# Patient Record
Sex: Female | Born: 1935
Health system: Southern US, Community
[De-identification: ages and names within clinical notes are randomized; demographics above are authoritative.]

## PROBLEM LIST (undated history)

## (undated) DIAGNOSIS — J189 Pneumonia, unspecified organism: Secondary | ICD-10-CM

## (undated) DIAGNOSIS — M1710 Unilateral primary osteoarthritis, unspecified knee: Secondary | ICD-10-CM

## (undated) DIAGNOSIS — E041 Nontoxic single thyroid nodule: Secondary | ICD-10-CM

## (undated) DIAGNOSIS — F419 Anxiety disorder, unspecified: Secondary | ICD-10-CM

## (undated) DIAGNOSIS — K5790 Diverticulosis of intestine, part unspecified, without perforation or abscess without bleeding: Secondary | ICD-10-CM

## (undated) DIAGNOSIS — E039 Hypothyroidism, unspecified: Secondary | ICD-10-CM

## (undated) DIAGNOSIS — E7800 Pure hypercholesterolemia, unspecified: Secondary | ICD-10-CM

## (undated) DIAGNOSIS — R351 Nocturia: Secondary | ICD-10-CM

## (undated) HISTORY — PX: TONSILLECTOMY: SUR1361

## (undated) HISTORY — DX: Diverticulosis of intestine, part unspecified, without perforation or abscess without bleeding: K57.90

## (undated) HISTORY — DX: Anxiety disorder, unspecified: F41.9

## (undated) HISTORY — DX: Nocturia: R35.1

## (undated) HISTORY — PX: BREAST EXCISIONAL BIOPSY: SUR124

## (undated) HISTORY — PX: APPENDECTOMY: SHX54

## (undated) HISTORY — DX: Unilateral primary osteoarthritis, unspecified knee: M17.10

## (undated) HISTORY — DX: Pure hypercholesterolemia, unspecified: E78.00

## (undated) HISTORY — DX: Pneumonia, unspecified organism: J18.9

## (undated) HISTORY — DX: Nontoxic single thyroid nodule: E04.1

## (undated) HISTORY — DX: Hypothyroidism, unspecified: E03.9

---

## 1998-03-27 ENCOUNTER — Other Ambulatory Visit: Admission: RE | Admit: 1998-03-27 | Discharge: 1998-03-27 | Payer: Self-pay | Admitting: Obstetrics and Gynecology

## 1999-04-08 ENCOUNTER — Other Ambulatory Visit: Admission: RE | Admit: 1999-04-08 | Discharge: 1999-04-08 | Payer: Self-pay | Admitting: Obstetrics and Gynecology

## 1999-05-11 ENCOUNTER — Ambulatory Visit (HOSPITAL_COMMUNITY): Admission: RE | Admit: 1999-05-11 | Discharge: 1999-05-11 | Payer: Self-pay | Admitting: Cardiology

## 2000-01-14 ENCOUNTER — Other Ambulatory Visit: Admission: RE | Admit: 2000-01-14 | Discharge: 2000-01-14 | Payer: Self-pay | Admitting: Obstetrics and Gynecology

## 2000-04-13 ENCOUNTER — Other Ambulatory Visit: Admission: RE | Admit: 2000-04-13 | Discharge: 2000-04-13 | Payer: Self-pay | Admitting: Obstetrics and Gynecology

## 2001-04-24 ENCOUNTER — Other Ambulatory Visit: Admission: RE | Admit: 2001-04-24 | Discharge: 2001-04-24 | Payer: Self-pay | Admitting: Obstetrics and Gynecology

## 2001-06-23 ENCOUNTER — Ambulatory Visit (HOSPITAL_BASED_OUTPATIENT_CLINIC_OR_DEPARTMENT_OTHER): Admission: RE | Admit: 2001-06-23 | Discharge: 2001-06-24 | Payer: Self-pay | Admitting: Orthopedic Surgery

## 2002-08-01 ENCOUNTER — Ambulatory Visit (HOSPITAL_BASED_OUTPATIENT_CLINIC_OR_DEPARTMENT_OTHER): Admission: RE | Admit: 2002-08-01 | Discharge: 2002-08-01 | Payer: Self-pay | Admitting: Orthopedic Surgery

## 2004-09-14 ENCOUNTER — Ambulatory Visit (HOSPITAL_COMMUNITY): Admission: RE | Admit: 2004-09-14 | Discharge: 2004-09-14 | Payer: Self-pay | Admitting: *Deleted

## 2006-04-06 ENCOUNTER — Emergency Department: Payer: Self-pay | Admitting: Emergency Medicine

## 2011-02-24 ENCOUNTER — Encounter: Payer: Self-pay | Admitting: Internal Medicine

## 2011-02-25 ENCOUNTER — Encounter: Payer: Self-pay | Admitting: Internal Medicine

## 2011-02-25 ENCOUNTER — Ambulatory Visit (INDEPENDENT_AMBULATORY_CARE_PROVIDER_SITE_OTHER): Payer: Medicare Other | Admitting: Internal Medicine

## 2011-02-25 ENCOUNTER — Ambulatory Visit (INDEPENDENT_AMBULATORY_CARE_PROVIDER_SITE_OTHER)
Admission: RE | Admit: 2011-02-25 | Discharge: 2011-02-25 | Disposition: A | Discharge status: Home / Self Care | Payer: Medicare Other | Source: Ambulatory Visit | Attending: Internal Medicine | Admitting: Internal Medicine

## 2011-02-25 VITALS — BP 112/60 | HR 80 | Temp 98.0°F | Ht 66.0 in | Wt 167.8 lb

## 2011-02-25 DIAGNOSIS — R0609 Other forms of dyspnea: Secondary | ICD-10-CM

## 2011-02-25 DIAGNOSIS — R0600 Dyspnea, unspecified: Secondary | ICD-10-CM

## 2011-02-25 DIAGNOSIS — R0989 Other specified symptoms and signs involving the circulatory and respiratory systems: Secondary | ICD-10-CM

## 2011-02-25 NOTE — Patient Instructions (Addendum)
GERD (REFLUX)  is an extremely common cause of respiratory symptoms just like yours many times with no significant heartburn at all.    It can be treated with medication, but also with lifestyle changes including avoidance of late meals, excessive alcohol, smoking cessation, and avoid fatty foods, chocolate, peppermint, colas, red wine, and acidic juices such as orange juice.  NO MINT OR MENTHOL PRODUCTS SO NO COUGH DROPS  USE SUGARLESS CANDY INSTEAD (jolley ranchers or Stover's)  NO OIL BASED VITAMINS - use powdered substitutes.  I strongly recommend you stay off biphosphonates by mouth for up to a year to see what effect if any it has on your breathing and recurrent flares of "pneumonia" and if needed for your bones you could use Reclast IV once yearly as a substitute per Dr Lisbeth Ply.  Please remember to go to the x-ray department downstairs for your tests - we will call you with the results when they are available.    Please schedule a follow up visit in 3 months but call sooner if needed with pfts

## 2011-02-25 NOTE — Assessment & Plan Note (Addendum)
-   02/25/2011  Walked RA x 3 laps @ 185 ft each stopped due to end of study, no desats  Chronic mild sob with now persistent as dz on cxr and pattern of freq flares raising the concern of underlying esophageal dysfunction/ aspiration or BOOP (but note the apparent improvement s steroids) or perhaps even Hypersensitivity pneumonitis.  Lack of desat now against UIP/ IPF syndromes though not excluded not really treatable so best option is to rx conservatively and do everything we can to eliminate future "exogenous" pulm insults starting with GERD/ aspiration via es dysfunction.  For now rec eliminate biphosphonates, reviewed diet and plan f/u here  To be complete, ddx includes pulmonary fibrosis associated with rheumatologic diseases (which have a relatively benign course in most cases and don't really fit clinically here) , adverse effect from  drugs such as chemotherapy or macrodantin  Exposure( which I could not get a hx of exposure to) and  nonspecific interstitial pneumonia which is typically steroid responsive but looks a lot like IPF.   In active  smokers Langerhan's Cell  Histiocyctosis (eosinophilic granuomatosis),  DIP,  and Respiratory Bronchiolitis ILD also need to be considered but don't apply here since not smoking.

## 2011-02-25 NOTE — Progress Notes (Signed)
  Subjective:    Patient ID: Jennifer Joyce, female    DOB: 1931/04/22, 77 y.o.   MRN: [id number]  HPI  80 yowf never smoker with h/o allergies and asthma  Worse in spring > fall but never on meds with pneumonia x 8 over her lifetime the most recent at [facility] Feb 12 2011 referred by Dr Jennifer Joyce 02/25/2011 for ? PF.  02/25/2011 Jennifer Joyce/1st pulmonary eval at baseline  able to do yard work but this mostly consisted of sitting on a mower no push mower and no raking and sob x one flight s stopping and shops at [employer] s [facility] parking then acutely worse 3 weeks ago with dx of pna and now less energy but no sob at rest, sleeping at night.  First typically notices onset of achy center of chest then cough and sob and better p abx as on this occasion with no maint rx.  Cough is mostly dry. On day of ov back to baseline  Sleeping ok without nocturnal  or early am exacerbation  of respiratory  c/o's or need for noct saba. Also denies any obvious fluctuation of symptoms with weather or environmental changes or other aggravating or alleviating factors except as outlined above    Review of Systems  Constitutional: Negative for fever, chills and unexpected weight change.  HENT: Negative for ear pain, nosebleeds, congestion, sore throat, rhinorrhea, sneezing, trouble swallowing, dental problem, voice change, postnasal drip and sinus pressure.   Eyes: Negative for visual disturbance.  Respiratory: Positive for cough and shortness of breath. Negative for choking.   Cardiovascular: Positive for chest pain and leg swelling.  Gastrointestinal: Negative for vomiting, abdominal pain and diarrhea.  Genitourinary: Negative for difficulty urinating.  Musculoskeletal: Negative for arthralgias.  Skin: Negative for rash.  Neurological: Negative for tremors, syncope and headaches.  Hematological: Does not bruise/bleed easily.       Objective:   Physical Exam  amb depressed wf with classic voice fatigue.  Wt 167  02/25/2011   HEENT: nl dentition, turbinates, and orophanx. Nl external ear canals without cough reflex   NECK :  without JVD/Nodes/TM/ nl carotid upstrokes bilaterally   LUNGS: no acc muscle use, clear to A and P bilaterally without cough on insp or exp maneuvers   CV:  RRR  no s3 or murmur or increase in P2, no edema   ABD:  soft and nontender with nl excursion in the supine position. No bruits or organomegaly, bowel sounds nl  MS:  warm without deformities, calf tenderness, cyanosis or clubbing  SKIN: warm and dry without lesions    NEURO:  alert, approp, no deficits        Assessment & Plan:

## 2011-02-26 ENCOUNTER — Telehealth: Payer: Self-pay | Admitting: *Deleted

## 2011-02-26 NOTE — Telephone Encounter (Signed)
Pt needs ov with cxr in 4 wks- cxr 02/25/11 does not sow much improvement- LMTCB

## 2011-02-26 NOTE — Telephone Encounter (Signed)
Spoke with pt and notified of recs per MW. She verbalized understanding and states no questions. OV with MW with cxr sched for 03/30/11 at 9:15 am.

## 2011-02-26 NOTE — Telephone Encounter (Signed)
Message copied by Rosana Berger on Fri Feb 26, 2011  9:17 AM ------      Message from: Christinia Gully B      Created: Thu Feb 25, 2011  8:34 PM       I don't remember signing off on her cxr so I must have hit the wrong button when reviewing results but she does have residual changes and so we need her back here in 4 weeks for f/u cxr, sooner if any of her previous symptoms recur.            We also need a copy of any swallowing studies she's ever had

## 2011-02-26 NOTE — Progress Notes (Signed)
Quick Note:  Spoke with pt and notified of results per Dr. Wert. Pt verbalized understanding and denied any questions.  ______ 

## 2011-03-30 ENCOUNTER — Encounter: Payer: Self-pay | Admitting: Internal Medicine

## 2011-03-30 ENCOUNTER — Ambulatory Visit (INDEPENDENT_AMBULATORY_CARE_PROVIDER_SITE_OTHER)
Admission: RE | Admit: 2011-03-30 | Discharge: 2011-03-30 | Disposition: A | Discharge status: Home / Self Care | Payer: Medicare Other | Source: Ambulatory Visit | Attending: Internal Medicine | Admitting: Internal Medicine

## 2011-03-30 ENCOUNTER — Ambulatory Visit (INDEPENDENT_AMBULATORY_CARE_PROVIDER_SITE_OTHER): Payer: Medicare Other | Admitting: Internal Medicine

## 2011-03-30 VITALS — BP 120/80 | HR 59 | Temp 97.7°F | Ht 66.0 in | Wt 171.4 lb

## 2011-03-30 DIAGNOSIS — R0989 Other specified symptoms and signs involving the circulatory and respiratory systems: Secondary | ICD-10-CM

## 2011-03-30 DIAGNOSIS — R0600 Dyspnea, unspecified: Secondary | ICD-10-CM

## 2011-03-30 DIAGNOSIS — R0609 Other forms of dyspnea: Secondary | ICD-10-CM

## 2011-03-30 NOTE — Assessment & Plan Note (Addendum)
-   02/25/2011  Walked RA x 3 laps @ 185 ft each stopped due to end of study, no desats  CXR clearly improving so most likely this is a form of "organizing pneumonia" not progressive pulmonary fibrosis - the main issue she's focused on is why so many pneumonias?  It would be extremely unlikely she has some form of immunocompromise given the pattern ( every few years or so) - if becoming more frequent with aging then aspiration syndromes would have to be considered and in this context need to make sure GERD / dysphagia are adequately addressed and avoid any meds that could make this worse, esp oral biphosphonates, oil based vitamins, etc (reviewed diet)

## 2011-03-30 NOTE — Progress Notes (Signed)
  Subjective:    Patient ID: Jennifer Joyce, female    DOB: 1931-04-30, 77 y.o.   MRN: [id number]  HPI  69 yowf never smoker with h/o allergies and asthma  Worse in spring > fall but never on meds with pneumonia x 8 over her lifetime the most recent at [facility] Feb 12 2011 referred by Dr Lisbeth Ply 02/25/2011 for ? PF.  02/25/2011 Sequoyah Counterman/1st pulmonary eval at baseline  able to do yard work but this mostly consisted of sitting on a mower no push mower and no raking and sob x one flight s stopping and shops at [employer] s [facility] parking then acutely worse 3 weeks ago with dx of pna and now less energy but no sob at rest, sleeping at night.  First typically notices onset of achy center of chest then cough and sob and better p abx as on this occasion with no maint rx.  Cough is mostly dry. On day of ov back to baseline rec GERD  diet I strongly recommend you stay off biphosphonates by mouth for up to a year   03/30/2011 f/u ov/Cloyce Blankenhorn cc Pt states breathing unchanged since last visit- no better or worse - energy and cough better, doe x steps, still sense of choking some but clearly better off biphosphonates.   Sleeping ok without nocturnal  or early am exacerbation  of respiratory  c/o's or need for noct saba. Also denies any obvious fluctuation of symptoms with weather or environmental changes or other aggravating or alleviating factors except as outlined above.   ROS  At present neg for  any significant sore throat, dysphagia, itching, sneezing,  nasal congestion or excess/ purulent secretions,  fever, chills, sweats, unintended wt loss, pleuritic or exertional cp, hempoptysis, orthopnea pnd or leg swelling.  Also denies presyncope, palpitations, heartburn, abdominal pain, nausea, vomiting, diarrhea  or change in bowel or urinary habits, dysuria,hematuria,  rash, arthralgias, visual complaints, headache, numbness weakness or ataxia.           Objective:   Physical Exam  amb wf with minimal voice  fatigue  Wt 167 02/25/2011  > 03/30/2011  171  HEENT: nl dentition, turbinates, and orophanx. Nl external ear canals without cough reflex   NECK :  without JVD/Nodes/TM/ nl carotid upstrokes bilaterally   LUNGS: no acc muscle use, clear to A and P bilaterally without cough on insp or exp maneuvers   CV:  RRR  no s3 or murmur or increase in P2, no edema   ABD:  soft and nontender with nl excursion in the supine position. No bruits or organomegaly, bowel sounds nl  MS:  warm without deformities, calf tenderness, cyanosis or clubbing     CXR  03/30/2011 :  Some improvement in foci of patchy airspace disease noted previously.        Assessment & Plan:

## 2011-03-30 NOTE — Patient Instructions (Addendum)
Pepcid 20 mg one at bedtime as long as any coughing or choking or hoarseness or indigestion  Continue diet restrictions  Please schedule a follow up office visit in 6 weeks, call sooner if needed for final follow up cxr

## 2011-04-14 DIAGNOSIS — H5210 Myopia, unspecified eye: Secondary | ICD-10-CM | POA: Diagnosis not present

## 2011-04-14 DIAGNOSIS — H3530 Unspecified macular degeneration: Secondary | ICD-10-CM | POA: Diagnosis not present

## 2011-04-14 DIAGNOSIS — H2640 Unspecified secondary cataract: Secondary | ICD-10-CM | POA: Diagnosis not present

## 2011-05-04 DIAGNOSIS — Z1231 Encounter for screening mammogram for malignant neoplasm of breast: Secondary | ICD-10-CM | POA: Diagnosis not present

## 2011-05-04 DIAGNOSIS — Z8262 Family history of osteoporosis: Secondary | ICD-10-CM | POA: Diagnosis not present

## 2011-05-04 DIAGNOSIS — M810 Age-related osteoporosis without current pathological fracture: Secondary | ICD-10-CM | POA: Diagnosis not present

## 2011-05-18 ENCOUNTER — Other Ambulatory Visit: Payer: Self-pay | Admitting: *Deleted

## 2011-05-18 DIAGNOSIS — R0600 Dyspnea, unspecified: Secondary | ICD-10-CM

## 2011-05-19 ENCOUNTER — Encounter: Payer: Self-pay | Admitting: Internal Medicine

## 2011-05-19 ENCOUNTER — Ambulatory Visit (INDEPENDENT_AMBULATORY_CARE_PROVIDER_SITE_OTHER)
Admission: RE | Admit: 2011-05-19 | Discharge: 2011-05-19 | Disposition: A | Discharge status: Home / Self Care | Payer: Medicare Other | Source: Ambulatory Visit | Attending: Internal Medicine | Admitting: Internal Medicine

## 2011-05-19 ENCOUNTER — Ambulatory Visit (INDEPENDENT_AMBULATORY_CARE_PROVIDER_SITE_OTHER): Payer: Medicare Other | Admitting: Internal Medicine

## 2011-05-19 VITALS — BP 112/60 | HR 69 | Temp 97.7°F | Ht 65.0 in | Wt 170.0 lb

## 2011-05-19 DIAGNOSIS — J438 Other emphysema: Secondary | ICD-10-CM | POA: Diagnosis not present

## 2011-05-19 DIAGNOSIS — R0609 Other forms of dyspnea: Secondary | ICD-10-CM

## 2011-05-19 DIAGNOSIS — R0989 Other specified symptoms and signs involving the circulatory and respiratory systems: Secondary | ICD-10-CM

## 2011-05-19 DIAGNOSIS — R0600 Dyspnea, unspecified: Secondary | ICD-10-CM

## 2011-05-19 DIAGNOSIS — J189 Pneumonia, unspecified organism: Secondary | ICD-10-CM | POA: Diagnosis not present

## 2011-05-19 DIAGNOSIS — J9819 Other pulmonary collapse: Secondary | ICD-10-CM | POA: Diagnosis not present

## 2011-05-19 NOTE — Assessment & Plan Note (Signed)
-   02/25/2011  Walked RA x 3 laps @ 185 ft each stopped due to end of study, no desats   Her cxr is back to baseline showing some scarring bilaterally c/w prev pneumonia(s) and does need further attention x to point out the need to prevent further lung injury in the future, esp related to the GI tract. She notices less symptoms when taking Pepcid so rec she continue at least an hs dose and avoid oral biphosphonates with low threshold to do formal swallowing eval per ST/ barium swallow studies if not improving.

## 2011-05-19 NOTE — Progress Notes (Signed)
  Subjective:    Patient ID: Jennifer Joyce, female    DOB: 23-Sep-1931.   MRN: [id number]  HPI  Brief patient profile:  60 yowf never smoker with h/o allergies and asthma  Worse in spring > fall but never on meds with pneumonia x 8 over her lifetime the most recent at [facility] Feb 12 2011 referred by Dr Lisbeth Ply 02/25/2011 for ? PF.  02/25/2011 Jennifer Joyce/1st pulmonary eval at baseline  able to do yard work but this mostly consisted of sitting on a mower no push mower and no raking and sob x one flight s stopping and shops at [employer] s [facility] parking then acutely worse 3 weeks ago with dx of pna and now less energy but no sob at rest, sleeping at night.  First typically notices onset of achy center of chest then cough and sob and better p abx as on this occasion with no maint rx.  Cough is mostly dry. On day of ov back to baseline rec GERD  diet I strongly recommend you stay off biphosphonates by mouth for up to a year   03/30/2011 f/u ov/Jennifer Joyce cc Pt states breathing unchanged since last visit- no better or worse - energy and cough better, doe x steps, still sense of choking some but clearly better off biphosphonates.  rec Pepcid 20 mg one at bedtime as long as any coughing or choking or hoarseness or indigestion Continue diet restrictions cxr in 6 weeks    05/19/2011 f/u ov/Jennifer Joyce cc sense of choking better when remembers to take pepcid at hs, doe = baseline one flight of steps/ walking for 15 min, not really limiting daytime function. No cough/ excess sputum production  Sleeping ok without nocturnal  or early am exacerbation  of respiratory  c/o's or need for noct saba. Also denies any obvious fluctuation of symptoms with weather or environmental changes or other aggravating or alleviating factors except as outlined above.   ROS  At present neg for  any significant sore throat, dysphagia, itching, sneezing,  nasal congestion or excess/ purulent secretions,  fever, chills, sweats, unintended wt loss,  pleuritic or exertional cp, hempoptysis, orthopnea pnd or leg swelling.  Also denies presyncope, palpitations, heartburn, abdominal pain, nausea, vomiting, diarrhea  or change in bowel or urinary habits, dysuria,hematuria,  rash, arthralgias, visual complaints, headache, numbness weakness or ataxia.           Objective:   Physical Exam  amb wf with minimal voice fatigue  Wt 167 02/25/2011  > 03/30/2011  171> 05/19/2011  170  HEENT: nl dentition, turbinates, and orophanx. Nl external ear canals without cough reflex   NECK :  without JVD/Nodes/TM/ nl carotid upstrokes bilaterally   LUNGS: no acc muscle use, clear to A and P bilaterally without cough on insp or exp maneuvers   CV:  RRR  no s3 or murmur or increase in P2, no edema   ABD:  soft and nontender with nl excursion in the supine position. No bruits or organomegaly, bowel sounds nl  MS:  warm without deformities, calf tenderness, cyanosis or clubbing      CXR  05/19/2011 :   Emphysematous and chronic bronchitic changes with bibasilar  atelectasis versus scarring.  Improved basilar airspace infiltrates.        Assessment & Plan:

## 2011-05-19 NOTE — Patient Instructions (Signed)
Unless you feel you loosing ground with activity tolerance due to breathing your follow up here can be as needed  Based on the intermittent choking sensation you probably have reflux and should take at least pepcid 20 mg one at bedtime and avoid biphosphonates by mouth like actonel but you could if necessary use the IV form = Reclast   GERD (REFLUX)  is an extremely common cause of respiratory symptoms, many times with no significant heartburn at all.    It can be treated with medication, but also with lifestyle changes including avoidance of late meals, excessive alcohol, smoking cessation, and avoid fatty foods, chocolate, peppermint, colas, red wine, and acidic juices such as orange juice.  NO MINT OR MENTHOL PRODUCTS SO NO COUGH DROPS  USE SUGARLESS CANDY INSTEAD (jolley ranchers or Stover's)  NO OIL BASED VITAMINS - use powdered substitutes.    Return is as needed

## 2011-07-09 DIAGNOSIS — H35369 Drusen (degenerative) of macula, unspecified eye: Secondary | ICD-10-CM | POA: Diagnosis not present

## 2011-07-09 DIAGNOSIS — H353190 Nonexudative age-related macular degeneration, unspecified eye, stage unspecified: Secondary | ICD-10-CM | POA: Diagnosis not present

## 2011-09-17 DIAGNOSIS — M79609 Pain in unspecified limb: Secondary | ICD-10-CM | POA: Diagnosis not present

## 2011-09-17 DIAGNOSIS — R609 Edema, unspecified: Secondary | ICD-10-CM | POA: Diagnosis not present

## 2011-09-22 DIAGNOSIS — Z79899 Other long term (current) drug therapy: Secondary | ICD-10-CM | POA: Diagnosis not present

## 2011-09-22 DIAGNOSIS — E7800 Pure hypercholesterolemia, unspecified: Secondary | ICD-10-CM | POA: Diagnosis not present

## 2011-09-22 DIAGNOSIS — E039 Hypothyroidism, unspecified: Secondary | ICD-10-CM | POA: Diagnosis not present

## 2011-09-24 DIAGNOSIS — E039 Hypothyroidism, unspecified: Secondary | ICD-10-CM | POA: Diagnosis not present

## 2011-09-24 DIAGNOSIS — E7800 Pure hypercholesterolemia, unspecified: Secondary | ICD-10-CM | POA: Diagnosis not present

## 2011-09-24 DIAGNOSIS — F411 Generalized anxiety disorder: Secondary | ICD-10-CM | POA: Diagnosis not present

## 2011-09-24 DIAGNOSIS — R7309 Other abnormal glucose: Secondary | ICD-10-CM | POA: Diagnosis not present

## 2011-09-29 ENCOUNTER — Ambulatory Visit (HOSPITAL_COMMUNITY): Payer: Medicare Other | Attending: Cardiology | Admitting: Radiology

## 2011-09-29 ENCOUNTER — Other Ambulatory Visit (HOSPITAL_COMMUNITY): Payer: Self-pay | Admitting: Radiology

## 2011-09-29 DIAGNOSIS — I359 Nonrheumatic aortic valve disorder, unspecified: Secondary | ICD-10-CM | POA: Diagnosis not present

## 2011-09-29 DIAGNOSIS — R0609 Other forms of dyspnea: Secondary | ICD-10-CM | POA: Diagnosis not present

## 2011-09-29 DIAGNOSIS — M7989 Other specified soft tissue disorders: Secondary | ICD-10-CM | POA: Insufficient documentation

## 2011-09-29 DIAGNOSIS — R0989 Other specified symptoms and signs involving the circulatory and respiratory systems: Secondary | ICD-10-CM | POA: Diagnosis not present

## 2011-09-29 DIAGNOSIS — I079 Rheumatic tricuspid valve disease, unspecified: Secondary | ICD-10-CM | POA: Diagnosis not present

## 2011-09-29 DIAGNOSIS — R5381 Other malaise: Secondary | ICD-10-CM | POA: Insufficient documentation

## 2011-09-29 DIAGNOSIS — R609 Edema, unspecified: Secondary | ICD-10-CM

## 2011-09-29 DIAGNOSIS — R5383 Other fatigue: Secondary | ICD-10-CM | POA: Insufficient documentation

## 2011-09-29 NOTE — Progress Notes (Signed)
Echocardiogram performed.  

## 2011-09-30 ENCOUNTER — Encounter (HOSPITAL_COMMUNITY): Payer: Self-pay | Admitting: Family Medicine

## 2011-10-01 DIAGNOSIS — R0609 Other forms of dyspnea: Secondary | ICD-10-CM | POA: Diagnosis not present

## 2011-10-04 DIAGNOSIS — D649 Anemia, unspecified: Secondary | ICD-10-CM | POA: Diagnosis not present

## 2011-10-04 DIAGNOSIS — D631 Anemia in chronic kidney disease: Secondary | ICD-10-CM | POA: Diagnosis not present

## 2011-10-06 DIAGNOSIS — R5383 Other fatigue: Secondary | ICD-10-CM | POA: Diagnosis not present

## 2011-10-06 DIAGNOSIS — R5381 Other malaise: Secondary | ICD-10-CM | POA: Diagnosis not present

## 2011-10-06 DIAGNOSIS — Z1212 Encounter for screening for malignant neoplasm of rectum: Secondary | ICD-10-CM | POA: Diagnosis not present

## 2011-10-08 DIAGNOSIS — Z1212 Encounter for screening for malignant neoplasm of rectum: Secondary | ICD-10-CM | POA: Diagnosis not present

## 2011-10-11 DIAGNOSIS — R3129 Other microscopic hematuria: Secondary | ICD-10-CM | POA: Diagnosis not present

## 2011-10-11 DIAGNOSIS — R319 Hematuria, unspecified: Secondary | ICD-10-CM | POA: Diagnosis not present

## 2011-10-12 DIAGNOSIS — R509 Fever, unspecified: Secondary | ICD-10-CM | POA: Diagnosis not present

## 2011-10-12 DIAGNOSIS — R195 Other fecal abnormalities: Secondary | ICD-10-CM | POA: Diagnosis not present

## 2011-10-12 DIAGNOSIS — R5381 Other malaise: Secondary | ICD-10-CM | POA: Diagnosis not present

## 2011-10-12 DIAGNOSIS — E039 Hypothyroidism, unspecified: Secondary | ICD-10-CM | POA: Diagnosis not present

## 2011-10-12 DIAGNOSIS — E7800 Pure hypercholesterolemia, unspecified: Secondary | ICD-10-CM | POA: Diagnosis not present

## 2011-10-12 DIAGNOSIS — D649 Anemia, unspecified: Secondary | ICD-10-CM | POA: Diagnosis not present

## 2011-10-12 DIAGNOSIS — N390 Urinary tract infection, site not specified: Secondary | ICD-10-CM | POA: Diagnosis not present

## 2011-10-12 DIAGNOSIS — R7881 Bacteremia: Secondary | ICD-10-CM | POA: Diagnosis not present

## 2011-10-12 DIAGNOSIS — R0602 Shortness of breath: Secondary | ICD-10-CM | POA: Diagnosis not present

## 2011-10-12 DIAGNOSIS — R5383 Other fatigue: Secondary | ICD-10-CM | POA: Diagnosis not present

## 2011-10-13 DIAGNOSIS — R262 Difficulty in walking, not elsewhere classified: Secondary | ICD-10-CM | POA: Diagnosis not present

## 2011-10-13 DIAGNOSIS — F411 Generalized anxiety disorder: Secondary | ICD-10-CM | POA: Diagnosis present

## 2011-10-13 DIAGNOSIS — I509 Heart failure, unspecified: Secondary | ICD-10-CM | POA: Diagnosis not present

## 2011-10-13 DIAGNOSIS — I059 Rheumatic mitral valve disease, unspecified: Secondary | ICD-10-CM | POA: Diagnosis not present

## 2011-10-13 DIAGNOSIS — I5021 Acute systolic (congestive) heart failure: Secondary | ICD-10-CM | POA: Diagnosis not present

## 2011-10-13 DIAGNOSIS — R011 Cardiac murmur, unspecified: Secondary | ICD-10-CM | POA: Diagnosis not present

## 2011-10-13 DIAGNOSIS — R9431 Abnormal electrocardiogram [ECG] [EKG]: Secondary | ICD-10-CM | POA: Diagnosis not present

## 2011-10-13 DIAGNOSIS — R0602 Shortness of breath: Secondary | ICD-10-CM | POA: Diagnosis not present

## 2011-10-13 DIAGNOSIS — R627 Adult failure to thrive: Secondary | ICD-10-CM | POA: Diagnosis present

## 2011-10-13 DIAGNOSIS — Z5189 Encounter for other specified aftercare: Secondary | ICD-10-CM | POA: Diagnosis not present

## 2011-10-13 DIAGNOSIS — R195 Other fecal abnormalities: Secondary | ICD-10-CM | POA: Diagnosis present

## 2011-10-13 DIAGNOSIS — B952 Enterococcus as the cause of diseases classified elsewhere: Secondary | ICD-10-CM | POA: Diagnosis present

## 2011-10-13 DIAGNOSIS — I369 Nonrheumatic tricuspid valve disorder, unspecified: Secondary | ICD-10-CM | POA: Diagnosis not present

## 2011-10-13 DIAGNOSIS — R5381 Other malaise: Secondary | ICD-10-CM | POA: Diagnosis present

## 2011-10-13 DIAGNOSIS — D62 Acute posthemorrhagic anemia: Secondary | ICD-10-CM | POA: Diagnosis not present

## 2011-10-13 DIAGNOSIS — D500 Iron deficiency anemia secondary to blood loss (chronic): Secondary | ICD-10-CM | POA: Diagnosis present

## 2011-10-13 DIAGNOSIS — Z0389 Encounter for observation for other suspected diseases and conditions ruled out: Secondary | ICD-10-CM | POA: Diagnosis not present

## 2011-10-13 DIAGNOSIS — I339 Acute and subacute endocarditis, unspecified: Secondary | ICD-10-CM | POA: Diagnosis not present

## 2011-10-13 DIAGNOSIS — N179 Acute kidney failure, unspecified: Secondary | ICD-10-CM | POA: Diagnosis not present

## 2011-10-13 DIAGNOSIS — I38 Endocarditis, valve unspecified: Secondary | ICD-10-CM | POA: Diagnosis not present

## 2011-10-13 DIAGNOSIS — R5383 Other fatigue: Secondary | ICD-10-CM | POA: Diagnosis not present

## 2011-10-13 DIAGNOSIS — H3530 Unspecified macular degeneration: Secondary | ICD-10-CM | POA: Diagnosis not present

## 2011-10-13 DIAGNOSIS — D638 Anemia in other chronic diseases classified elsewhere: Secondary | ICD-10-CM | POA: Diagnosis present

## 2011-10-13 DIAGNOSIS — F329 Major depressive disorder, single episode, unspecified: Secondary | ICD-10-CM | POA: Diagnosis present

## 2011-10-13 DIAGNOSIS — R112 Nausea with vomiting, unspecified: Secondary | ICD-10-CM | POA: Diagnosis not present

## 2011-10-13 DIAGNOSIS — E8779 Other fluid overload: Secondary | ICD-10-CM | POA: Diagnosis not present

## 2011-10-13 DIAGNOSIS — A419 Sepsis, unspecified organism: Secondary | ICD-10-CM | POA: Diagnosis not present

## 2011-10-13 DIAGNOSIS — J189 Pneumonia, unspecified organism: Secondary | ICD-10-CM | POA: Diagnosis not present

## 2011-10-13 DIAGNOSIS — E039 Hypothyroidism, unspecified: Secondary | ICD-10-CM | POA: Diagnosis present

## 2011-10-13 DIAGNOSIS — K922 Gastrointestinal hemorrhage, unspecified: Secondary | ICD-10-CM | POA: Diagnosis not present

## 2011-10-13 DIAGNOSIS — N390 Urinary tract infection, site not specified: Secondary | ICD-10-CM | POA: Diagnosis not present

## 2011-10-13 DIAGNOSIS — A409 Streptococcal sepsis, unspecified: Secondary | ICD-10-CM | POA: Diagnosis not present

## 2011-10-13 DIAGNOSIS — M6281 Muscle weakness (generalized): Secondary | ICD-10-CM | POA: Diagnosis not present

## 2011-10-13 DIAGNOSIS — J154 Pneumonia due to other streptococci: Secondary | ICD-10-CM | POA: Diagnosis not present

## 2011-10-13 DIAGNOSIS — I501 Left ventricular failure: Secondary | ICD-10-CM | POA: Diagnosis not present

## 2011-10-13 DIAGNOSIS — I129 Hypertensive chronic kidney disease with stage 1 through stage 4 chronic kidney disease, or unspecified chronic kidney disease: Secondary | ICD-10-CM | POA: Diagnosis present

## 2011-10-13 DIAGNOSIS — I330 Acute and subacute infective endocarditis: Secondary | ICD-10-CM | POA: Diagnosis present

## 2011-10-13 DIAGNOSIS — R7881 Bacteremia: Secondary | ICD-10-CM | POA: Diagnosis not present

## 2011-10-13 DIAGNOSIS — N189 Chronic kidney disease, unspecified: Secondary | ICD-10-CM | POA: Diagnosis not present

## 2011-10-13 DIAGNOSIS — N184 Chronic kidney disease, stage 4 (severe): Secondary | ICD-10-CM | POA: Diagnosis not present

## 2011-10-13 DIAGNOSIS — D649 Anemia, unspecified: Secondary | ICD-10-CM | POA: Diagnosis not present

## 2011-10-13 DIAGNOSIS — E7800 Pure hypercholesterolemia, unspecified: Secondary | ICD-10-CM | POA: Diagnosis not present

## 2011-10-24 DIAGNOSIS — F329 Major depressive disorder, single episode, unspecified: Secondary | ICD-10-CM | POA: Diagnosis not present

## 2011-10-24 DIAGNOSIS — N189 Chronic kidney disease, unspecified: Secondary | ICD-10-CM | POA: Diagnosis not present

## 2011-10-24 DIAGNOSIS — Z79899 Other long term (current) drug therapy: Secondary | ICD-10-CM | POA: Diagnosis not present

## 2011-10-24 DIAGNOSIS — Z5189 Encounter for other specified aftercare: Secondary | ICD-10-CM | POA: Diagnosis not present

## 2011-10-24 DIAGNOSIS — R262 Difficulty in walking, not elsewhere classified: Secondary | ICD-10-CM | POA: Diagnosis not present

## 2011-10-24 DIAGNOSIS — F411 Generalized anxiety disorder: Secondary | ICD-10-CM | POA: Diagnosis present

## 2011-10-24 DIAGNOSIS — E785 Hyperlipidemia, unspecified: Secondary | ICD-10-CM | POA: Diagnosis not present

## 2011-10-24 DIAGNOSIS — R9431 Abnormal electrocardiogram [ECG] [EKG]: Secondary | ICD-10-CM | POA: Diagnosis not present

## 2011-10-24 DIAGNOSIS — K922 Gastrointestinal hemorrhage, unspecified: Secondary | ICD-10-CM | POA: Diagnosis not present

## 2011-10-24 DIAGNOSIS — B952 Enterococcus as the cause of diseases classified elsewhere: Secondary | ICD-10-CM | POA: Diagnosis not present

## 2011-10-24 DIAGNOSIS — I359 Nonrheumatic aortic valve disorder, unspecified: Secondary | ICD-10-CM | POA: Diagnosis not present

## 2011-10-24 DIAGNOSIS — I339 Acute and subacute endocarditis, unspecified: Secondary | ICD-10-CM | POA: Diagnosis not present

## 2011-10-24 DIAGNOSIS — R6520 Severe sepsis without septic shock: Secondary | ICD-10-CM | POA: Diagnosis not present

## 2011-10-24 DIAGNOSIS — E8779 Other fluid overload: Secondary | ICD-10-CM | POA: Diagnosis not present

## 2011-10-24 DIAGNOSIS — I330 Acute and subacute infective endocarditis: Secondary | ICD-10-CM | POA: Diagnosis not present

## 2011-10-24 DIAGNOSIS — F3289 Other specified depressive episodes: Secondary | ICD-10-CM | POA: Diagnosis not present

## 2011-10-24 DIAGNOSIS — J9600 Acute respiratory failure, unspecified whether with hypoxia or hypercapnia: Secondary | ICD-10-CM | POA: Diagnosis not present

## 2011-10-24 DIAGNOSIS — D631 Anemia in chronic kidney disease: Secondary | ICD-10-CM | POA: Diagnosis not present

## 2011-10-24 DIAGNOSIS — J811 Chronic pulmonary edema: Secondary | ICD-10-CM | POA: Diagnosis not present

## 2011-10-24 DIAGNOSIS — H3530 Unspecified macular degeneration: Secondary | ICD-10-CM | POA: Diagnosis present

## 2011-10-24 DIAGNOSIS — N179 Acute kidney failure, unspecified: Secondary | ICD-10-CM | POA: Diagnosis not present

## 2011-10-24 DIAGNOSIS — I501 Left ventricular failure: Secondary | ICD-10-CM | POA: Diagnosis not present

## 2011-10-24 DIAGNOSIS — N039 Chronic nephritic syndrome with unspecified morphologic changes: Secondary | ICD-10-CM | POA: Diagnosis not present

## 2011-10-24 DIAGNOSIS — N289 Disorder of kidney and ureter, unspecified: Secondary | ICD-10-CM | POA: Diagnosis not present

## 2011-10-24 DIAGNOSIS — Z7901 Long term (current) use of anticoagulants: Secondary | ICD-10-CM | POA: Diagnosis not present

## 2011-10-24 DIAGNOSIS — R0602 Shortness of breath: Secondary | ICD-10-CM | POA: Diagnosis not present

## 2011-10-24 DIAGNOSIS — E039 Hypothyroidism, unspecified: Secondary | ICD-10-CM | POA: Diagnosis not present

## 2011-10-24 DIAGNOSIS — Z5181 Encounter for therapeutic drug level monitoring: Secondary | ICD-10-CM | POA: Diagnosis not present

## 2011-10-24 DIAGNOSIS — M6281 Muscle weakness (generalized): Secondary | ICD-10-CM | POA: Diagnosis not present

## 2011-10-24 DIAGNOSIS — N183 Chronic kidney disease, stage 3 unspecified: Secondary | ICD-10-CM | POA: Diagnosis not present

## 2011-10-24 DIAGNOSIS — I252 Old myocardial infarction: Secondary | ICD-10-CM | POA: Diagnosis not present

## 2011-10-24 DIAGNOSIS — I509 Heart failure, unspecified: Secondary | ICD-10-CM | POA: Diagnosis not present

## 2011-10-24 DIAGNOSIS — R197 Diarrhea, unspecified: Secondary | ICD-10-CM | POA: Diagnosis not present

## 2011-10-24 DIAGNOSIS — R627 Adult failure to thrive: Secondary | ICD-10-CM | POA: Diagnosis not present

## 2011-10-24 DIAGNOSIS — Z794 Long term (current) use of insulin: Secondary | ICD-10-CM | POA: Diagnosis not present

## 2011-10-24 DIAGNOSIS — J154 Pneumonia due to other streptococci: Secondary | ICD-10-CM | POA: Diagnosis not present

## 2011-10-24 DIAGNOSIS — I5021 Acute systolic (congestive) heart failure: Secondary | ICD-10-CM | POA: Diagnosis not present

## 2011-10-24 DIAGNOSIS — I38 Endocarditis, valve unspecified: Secondary | ICD-10-CM | POA: Diagnosis not present

## 2011-10-24 DIAGNOSIS — I369 Nonrheumatic tricuspid valve disorder, unspecified: Secondary | ICD-10-CM | POA: Diagnosis not present

## 2011-10-27 DIAGNOSIS — E039 Hypothyroidism, unspecified: Secondary | ICD-10-CM | POA: Diagnosis not present

## 2011-10-27 DIAGNOSIS — R197 Diarrhea, unspecified: Secondary | ICD-10-CM | POA: Diagnosis not present

## 2011-10-27 DIAGNOSIS — E785 Hyperlipidemia, unspecified: Secondary | ICD-10-CM | POA: Diagnosis not present

## 2011-10-27 DIAGNOSIS — I330 Acute and subacute infective endocarditis: Secondary | ICD-10-CM | POA: Diagnosis not present

## 2011-10-29 DIAGNOSIS — Z79899 Other long term (current) drug therapy: Secondary | ICD-10-CM | POA: Diagnosis not present

## 2011-10-29 DIAGNOSIS — N289 Disorder of kidney and ureter, unspecified: Secondary | ICD-10-CM | POA: Diagnosis not present

## 2011-10-29 DIAGNOSIS — I38 Endocarditis, valve unspecified: Secondary | ICD-10-CM | POA: Diagnosis not present

## 2011-10-30 DIAGNOSIS — Z7901 Long term (current) use of anticoagulants: Secondary | ICD-10-CM | POA: Diagnosis not present

## 2011-11-03 DIAGNOSIS — I509 Heart failure, unspecified: Secondary | ICD-10-CM | POA: Diagnosis not present

## 2011-11-03 DIAGNOSIS — I38 Endocarditis, valve unspecified: Secondary | ICD-10-CM | POA: Diagnosis not present

## 2011-11-03 DIAGNOSIS — N039 Chronic nephritic syndrome with unspecified morphologic changes: Secondary | ICD-10-CM | POA: Diagnosis not present

## 2011-11-03 DIAGNOSIS — F329 Major depressive disorder, single episode, unspecified: Secondary | ICD-10-CM | POA: Diagnosis not present

## 2011-11-03 DIAGNOSIS — I359 Nonrheumatic aortic valve disorder, unspecified: Secondary | ICD-10-CM | POA: Diagnosis not present

## 2011-11-03 DIAGNOSIS — F411 Generalized anxiety disorder: Secondary | ICD-10-CM | POA: Diagnosis present

## 2011-11-03 DIAGNOSIS — E785 Hyperlipidemia, unspecified: Secondary | ICD-10-CM | POA: Diagnosis present

## 2011-11-03 DIAGNOSIS — R9431 Abnormal electrocardiogram [ECG] [EKG]: Secondary | ICD-10-CM | POA: Diagnosis not present

## 2011-11-03 DIAGNOSIS — Z794 Long term (current) use of insulin: Secondary | ICD-10-CM | POA: Diagnosis not present

## 2011-11-03 DIAGNOSIS — N189 Chronic kidney disease, unspecified: Secondary | ICD-10-CM | POA: Diagnosis not present

## 2011-11-03 DIAGNOSIS — D631 Anemia in chronic kidney disease: Secondary | ICD-10-CM | POA: Diagnosis not present

## 2011-11-03 DIAGNOSIS — J9600 Acute respiratory failure, unspecified whether with hypoxia or hypercapnia: Secondary | ICD-10-CM | POA: Diagnosis not present

## 2011-11-03 DIAGNOSIS — I252 Old myocardial infarction: Secondary | ICD-10-CM | POA: Diagnosis not present

## 2011-11-03 DIAGNOSIS — E039 Hypothyroidism, unspecified: Secondary | ICD-10-CM | POA: Diagnosis not present

## 2011-11-03 DIAGNOSIS — R0602 Shortness of breath: Secondary | ICD-10-CM | POA: Diagnosis not present

## 2011-11-03 DIAGNOSIS — N179 Acute kidney failure, unspecified: Secondary | ICD-10-CM | POA: Diagnosis not present

## 2011-11-03 DIAGNOSIS — F3289 Other specified depressive episodes: Secondary | ICD-10-CM | POA: Diagnosis not present

## 2011-11-03 DIAGNOSIS — H3530 Unspecified macular degeneration: Secondary | ICD-10-CM | POA: Diagnosis present

## 2011-11-03 DIAGNOSIS — J811 Chronic pulmonary edema: Secondary | ICD-10-CM | POA: Diagnosis not present

## 2011-11-03 DIAGNOSIS — Z79899 Other long term (current) drug therapy: Secondary | ICD-10-CM | POA: Diagnosis not present

## 2011-11-03 DIAGNOSIS — N183 Chronic kidney disease, stage 3 unspecified: Secondary | ICD-10-CM | POA: Diagnosis not present

## 2011-11-04 DIAGNOSIS — R0901 Asphyxia: Secondary | ICD-10-CM | POA: Diagnosis not present

## 2011-11-04 DIAGNOSIS — I38 Endocarditis, valve unspecified: Secondary | ICD-10-CM | POA: Diagnosis not present

## 2011-11-04 DIAGNOSIS — G8918 Other acute postprocedural pain: Secondary | ICD-10-CM | POA: Diagnosis not present

## 2011-11-04 DIAGNOSIS — I498 Other specified cardiac arrhythmias: Secondary | ICD-10-CM | POA: Diagnosis not present

## 2011-11-04 DIAGNOSIS — R82998 Other abnormal findings in urine: Secondary | ICD-10-CM | POA: Diagnosis not present

## 2011-11-04 DIAGNOSIS — I369 Nonrheumatic tricuspid valve disorder, unspecified: Secondary | ICD-10-CM | POA: Diagnosis not present

## 2011-11-04 DIAGNOSIS — M79609 Pain in unspecified limb: Secondary | ICD-10-CM | POA: Diagnosis not present

## 2011-11-04 DIAGNOSIS — E789 Disorder of lipoprotein metabolism, unspecified: Secondary | ICD-10-CM | POA: Diagnosis not present

## 2011-11-04 DIAGNOSIS — Z79899 Other long term (current) drug therapy: Secondary | ICD-10-CM | POA: Diagnosis not present

## 2011-11-04 DIAGNOSIS — D696 Thrombocytopenia, unspecified: Secondary | ICD-10-CM | POA: Diagnosis not present

## 2011-11-04 DIAGNOSIS — R9431 Abnormal electrocardiogram [ECG] [EKG]: Secondary | ICD-10-CM | POA: Diagnosis not present

## 2011-11-04 DIAGNOSIS — N200 Calculus of kidney: Secondary | ICD-10-CM | POA: Diagnosis not present

## 2011-11-04 DIAGNOSIS — H269 Unspecified cataract: Secondary | ICD-10-CM | POA: Diagnosis not present

## 2011-11-04 DIAGNOSIS — F329 Major depressive disorder, single episode, unspecified: Secondary | ICD-10-CM | POA: Diagnosis present

## 2011-11-04 DIAGNOSIS — Z954 Presence of other heart-valve replacement: Secondary | ICD-10-CM | POA: Diagnosis not present

## 2011-11-04 DIAGNOSIS — Z951 Presence of aortocoronary bypass graft: Secondary | ICD-10-CM | POA: Diagnosis not present

## 2011-11-04 DIAGNOSIS — I330 Acute and subacute infective endocarditis: Secondary | ICD-10-CM | POA: Diagnosis not present

## 2011-11-04 DIAGNOSIS — D649 Anemia, unspecified: Secondary | ICD-10-CM | POA: Diagnosis not present

## 2011-11-04 DIAGNOSIS — Z48812 Encounter for surgical aftercare following surgery on the circulatory system: Secondary | ICD-10-CM | POA: Diagnosis not present

## 2011-11-04 DIAGNOSIS — J8410 Pulmonary fibrosis, unspecified: Secondary | ICD-10-CM | POA: Diagnosis not present

## 2011-11-04 DIAGNOSIS — J811 Chronic pulmonary edema: Secondary | ICD-10-CM | POA: Diagnosis not present

## 2011-11-04 DIAGNOSIS — E46 Unspecified protein-calorie malnutrition: Secondary | ICD-10-CM | POA: Diagnosis not present

## 2011-11-04 DIAGNOSIS — J90 Pleural effusion, not elsewhere classified: Secondary | ICD-10-CM | POA: Diagnosis not present

## 2011-11-04 DIAGNOSIS — R6889 Other general symptoms and signs: Secondary | ICD-10-CM | POA: Diagnosis not present

## 2011-11-04 DIAGNOSIS — N170 Acute kidney failure with tubular necrosis: Secondary | ICD-10-CM | POA: Diagnosis not present

## 2011-11-04 DIAGNOSIS — I359 Nonrheumatic aortic valve disorder, unspecified: Secondary | ICD-10-CM | POA: Diagnosis not present

## 2011-11-04 DIAGNOSIS — I5021 Acute systolic (congestive) heart failure: Secondary | ICD-10-CM | POA: Diagnosis not present

## 2011-11-04 DIAGNOSIS — Z5189 Encounter for other specified aftercare: Secondary | ICD-10-CM | POA: Diagnosis not present

## 2011-11-04 DIAGNOSIS — E8779 Other fluid overload: Secondary | ICD-10-CM | POA: Diagnosis not present

## 2011-11-04 DIAGNOSIS — B3749 Other urogenital candidiasis: Secondary | ICD-10-CM | POA: Diagnosis present

## 2011-11-04 DIAGNOSIS — I252 Old myocardial infarction: Secondary | ICD-10-CM | POA: Diagnosis not present

## 2011-11-04 DIAGNOSIS — E039 Hypothyroidism, unspecified: Secondary | ICD-10-CM | POA: Diagnosis present

## 2011-11-04 DIAGNOSIS — R0602 Shortness of breath: Secondary | ICD-10-CM | POA: Diagnosis not present

## 2011-11-04 DIAGNOSIS — N135 Crossing vessel and stricture of ureter without hydronephrosis: Secondary | ICD-10-CM | POA: Diagnosis not present

## 2011-11-04 DIAGNOSIS — Z01818 Encounter for other preprocedural examination: Secondary | ICD-10-CM | POA: Diagnosis not present

## 2011-11-04 DIAGNOSIS — N1330 Unspecified hydronephrosis: Secondary | ICD-10-CM | POA: Diagnosis not present

## 2011-11-04 DIAGNOSIS — E785 Hyperlipidemia, unspecified: Secondary | ICD-10-CM | POA: Diagnosis present

## 2011-11-04 DIAGNOSIS — I129 Hypertensive chronic kidney disease with stage 1 through stage 4 chronic kidney disease, or unspecified chronic kidney disease: Secondary | ICD-10-CM | POA: Diagnosis not present

## 2011-11-04 DIAGNOSIS — K5900 Constipation, unspecified: Secondary | ICD-10-CM | POA: Diagnosis not present

## 2011-11-04 DIAGNOSIS — D62 Acute posthemorrhagic anemia: Secondary | ICD-10-CM | POA: Diagnosis not present

## 2011-11-04 DIAGNOSIS — Z9861 Coronary angioplasty status: Secondary | ICD-10-CM | POA: Diagnosis not present

## 2011-11-04 DIAGNOSIS — J9819 Other pulmonary collapse: Secondary | ICD-10-CM | POA: Diagnosis not present

## 2011-11-04 DIAGNOSIS — N183 Chronic kidney disease, stage 3 unspecified: Secondary | ICD-10-CM | POA: Diagnosis not present

## 2011-11-04 DIAGNOSIS — N189 Chronic kidney disease, unspecified: Secondary | ICD-10-CM | POA: Diagnosis not present

## 2011-11-04 DIAGNOSIS — I959 Hypotension, unspecified: Secondary | ICD-10-CM | POA: Diagnosis not present

## 2011-11-04 DIAGNOSIS — H3530 Unspecified macular degeneration: Secondary | ICD-10-CM | POA: Diagnosis present

## 2011-11-04 DIAGNOSIS — M129 Arthropathy, unspecified: Secondary | ICD-10-CM | POA: Diagnosis present

## 2011-11-04 DIAGNOSIS — Z0389 Encounter for observation for other suspected diseases and conditions ruled out: Secondary | ICD-10-CM | POA: Diagnosis not present

## 2011-11-04 DIAGNOSIS — E875 Hyperkalemia: Secondary | ICD-10-CM | POA: Diagnosis not present

## 2011-11-04 DIAGNOSIS — I509 Heart failure, unspecified: Secondary | ICD-10-CM | POA: Diagnosis present

## 2011-11-04 DIAGNOSIS — M6289 Other specified disorders of muscle: Secondary | ICD-10-CM | POA: Diagnosis not present

## 2011-11-04 DIAGNOSIS — I2510 Atherosclerotic heart disease of native coronary artery without angina pectoris: Secondary | ICD-10-CM | POA: Diagnosis present

## 2011-11-04 DIAGNOSIS — R0902 Hypoxemia: Secondary | ICD-10-CM | POA: Diagnosis not present

## 2011-11-04 DIAGNOSIS — F411 Generalized anxiety disorder: Secondary | ICD-10-CM | POA: Diagnosis present

## 2011-11-04 DIAGNOSIS — N179 Acute kidney failure, unspecified: Secondary | ICD-10-CM | POA: Diagnosis not present

## 2011-11-05 DIAGNOSIS — I369 Nonrheumatic tricuspid valve disorder, unspecified: Secondary | ICD-10-CM | POA: Diagnosis not present

## 2011-11-10 DIAGNOSIS — Z01818 Encounter for other preprocedural examination: Secondary | ICD-10-CM | POA: Diagnosis not present

## 2011-11-20 DIAGNOSIS — M79609 Pain in unspecified limb: Secondary | ICD-10-CM | POA: Diagnosis not present

## 2011-11-20 DIAGNOSIS — M6289 Other specified disorders of muscle: Secondary | ICD-10-CM | POA: Diagnosis not present

## 2011-11-24 DIAGNOSIS — M129 Arthropathy, unspecified: Secondary | ICD-10-CM | POA: Diagnosis not present

## 2011-11-24 DIAGNOSIS — R6889 Other general symptoms and signs: Secondary | ICD-10-CM | POA: Diagnosis not present

## 2011-11-24 DIAGNOSIS — I359 Nonrheumatic aortic valve disorder, unspecified: Secondary | ICD-10-CM | POA: Diagnosis not present

## 2011-11-24 DIAGNOSIS — E875 Hyperkalemia: Secondary | ICD-10-CM | POA: Diagnosis not present

## 2011-11-24 DIAGNOSIS — H269 Unspecified cataract: Secondary | ICD-10-CM | POA: Diagnosis not present

## 2011-11-24 DIAGNOSIS — Z48812 Encounter for surgical aftercare following surgery on the circulatory system: Secondary | ICD-10-CM | POA: Diagnosis not present

## 2011-11-24 DIAGNOSIS — I2510 Atherosclerotic heart disease of native coronary artery without angina pectoris: Secondary | ICD-10-CM | POA: Diagnosis not present

## 2011-11-24 DIAGNOSIS — R9431 Abnormal electrocardiogram [ECG] [EKG]: Secondary | ICD-10-CM | POA: Diagnosis not present

## 2011-11-24 DIAGNOSIS — I38 Endocarditis, valve unspecified: Secondary | ICD-10-CM | POA: Diagnosis not present

## 2011-11-24 DIAGNOSIS — E039 Hypothyroidism, unspecified: Secondary | ICD-10-CM | POA: Diagnosis not present

## 2011-11-24 DIAGNOSIS — H3530 Unspecified macular degeneration: Secondary | ICD-10-CM | POA: Diagnosis not present

## 2011-11-24 DIAGNOSIS — Z951 Presence of aortocoronary bypass graft: Secondary | ICD-10-CM | POA: Diagnosis not present

## 2011-11-24 DIAGNOSIS — F329 Major depressive disorder, single episode, unspecified: Secondary | ICD-10-CM | POA: Diagnosis not present

## 2011-11-24 DIAGNOSIS — F411 Generalized anxiety disorder: Secondary | ICD-10-CM | POA: Diagnosis not present

## 2011-11-24 DIAGNOSIS — Z954 Presence of other heart-valve replacement: Secondary | ICD-10-CM | POA: Diagnosis not present

## 2011-11-24 DIAGNOSIS — Z5189 Encounter for other specified aftercare: Secondary | ICD-10-CM | POA: Diagnosis not present

## 2011-11-24 DIAGNOSIS — D649 Anemia, unspecified: Secondary | ICD-10-CM | POA: Diagnosis present

## 2011-11-24 DIAGNOSIS — E789 Disorder of lipoprotein metabolism, unspecified: Secondary | ICD-10-CM | POA: Diagnosis not present

## 2011-11-24 DIAGNOSIS — E785 Hyperlipidemia, unspecified: Secondary | ICD-10-CM | POA: Diagnosis present

## 2011-11-24 DIAGNOSIS — N183 Chronic kidney disease, stage 3 unspecified: Secondary | ICD-10-CM | POA: Diagnosis present

## 2011-11-29 DIAGNOSIS — D649 Anemia, unspecified: Secondary | ICD-10-CM | POA: Diagnosis not present

## 2011-11-29 DIAGNOSIS — I38 Endocarditis, valve unspecified: Secondary | ICD-10-CM | POA: Diagnosis not present

## 2011-11-29 DIAGNOSIS — E039 Hypothyroidism, unspecified: Secondary | ICD-10-CM | POA: Diagnosis not present

## 2011-12-03 DIAGNOSIS — Z951 Presence of aortocoronary bypass graft: Secondary | ICD-10-CM | POA: Diagnosis not present

## 2011-12-03 DIAGNOSIS — D649 Anemia, unspecified: Secondary | ICD-10-CM | POA: Diagnosis present

## 2011-12-03 DIAGNOSIS — Z952 Presence of prosthetic heart valve: Secondary | ICD-10-CM | POA: Diagnosis not present

## 2011-12-03 DIAGNOSIS — E039 Hypothyroidism, unspecified: Secondary | ICD-10-CM | POA: Diagnosis not present

## 2011-12-03 DIAGNOSIS — I38 Endocarditis, valve unspecified: Secondary | ICD-10-CM | POA: Diagnosis not present

## 2011-12-03 DIAGNOSIS — N183 Chronic kidney disease, stage 3 unspecified: Secondary | ICD-10-CM | POA: Diagnosis present

## 2011-12-03 DIAGNOSIS — E875 Hyperkalemia: Secondary | ICD-10-CM | POA: Diagnosis not present

## 2011-12-03 DIAGNOSIS — I359 Nonrheumatic aortic valve disorder, unspecified: Secondary | ICD-10-CM | POA: Diagnosis present

## 2011-12-03 DIAGNOSIS — H269 Unspecified cataract: Secondary | ICD-10-CM | POA: Diagnosis not present

## 2011-12-03 DIAGNOSIS — E789 Disorder of lipoprotein metabolism, unspecified: Secondary | ICD-10-CM | POA: Diagnosis not present

## 2011-12-03 DIAGNOSIS — Z48812 Encounter for surgical aftercare following surgery on the circulatory system: Secondary | ICD-10-CM | POA: Diagnosis not present

## 2011-12-03 DIAGNOSIS — E785 Hyperlipidemia, unspecified: Secondary | ICD-10-CM | POA: Diagnosis present

## 2011-12-03 DIAGNOSIS — I2510 Atherosclerotic heart disease of native coronary artery without angina pectoris: Secondary | ICD-10-CM | POA: Diagnosis not present

## 2011-12-03 DIAGNOSIS — R9431 Abnormal electrocardiogram [ECG] [EKG]: Secondary | ICD-10-CM | POA: Diagnosis not present

## 2011-12-03 DIAGNOSIS — F411 Generalized anxiety disorder: Secondary | ICD-10-CM | POA: Diagnosis present

## 2011-12-03 DIAGNOSIS — H3530 Unspecified macular degeneration: Secondary | ICD-10-CM | POA: Diagnosis not present

## 2011-12-03 DIAGNOSIS — Z954 Presence of other heart-valve replacement: Secondary | ICD-10-CM | POA: Diagnosis not present

## 2011-12-03 DIAGNOSIS — Z5189 Encounter for other specified aftercare: Secondary | ICD-10-CM | POA: Diagnosis not present

## 2011-12-03 DIAGNOSIS — F329 Major depressive disorder, single episode, unspecified: Secondary | ICD-10-CM | POA: Diagnosis present

## 2011-12-03 DIAGNOSIS — M129 Arthropathy, unspecified: Secondary | ICD-10-CM | POA: Diagnosis not present

## 2011-12-04 DIAGNOSIS — E875 Hyperkalemia: Secondary | ICD-10-CM | POA: Diagnosis not present

## 2011-12-04 DIAGNOSIS — Z951 Presence of aortocoronary bypass graft: Secondary | ICD-10-CM | POA: Diagnosis not present

## 2011-12-04 DIAGNOSIS — I38 Endocarditis, valve unspecified: Secondary | ICD-10-CM | POA: Diagnosis not present

## 2011-12-04 DIAGNOSIS — D649 Anemia, unspecified: Secondary | ICD-10-CM | POA: Diagnosis not present

## 2011-12-04 DIAGNOSIS — E039 Hypothyroidism, unspecified: Secondary | ICD-10-CM | POA: Diagnosis not present

## 2011-12-04 DIAGNOSIS — Z952 Presence of prosthetic heart valve: Secondary | ICD-10-CM | POA: Diagnosis not present

## 2011-12-05 DIAGNOSIS — E039 Hypothyroidism, unspecified: Secondary | ICD-10-CM | POA: Diagnosis not present

## 2011-12-05 DIAGNOSIS — Z952 Presence of prosthetic heart valve: Secondary | ICD-10-CM | POA: Diagnosis not present

## 2011-12-05 DIAGNOSIS — D649 Anemia, unspecified: Secondary | ICD-10-CM | POA: Diagnosis not present

## 2011-12-05 DIAGNOSIS — Z951 Presence of aortocoronary bypass graft: Secondary | ICD-10-CM | POA: Diagnosis not present

## 2011-12-05 DIAGNOSIS — E875 Hyperkalemia: Secondary | ICD-10-CM | POA: Diagnosis not present

## 2011-12-05 DIAGNOSIS — I38 Endocarditis, valve unspecified: Secondary | ICD-10-CM | POA: Diagnosis not present

## 2011-12-06 DIAGNOSIS — Z951 Presence of aortocoronary bypass graft: Secondary | ICD-10-CM | POA: Diagnosis not present

## 2011-12-06 DIAGNOSIS — N183 Chronic kidney disease, stage 3 unspecified: Secondary | ICD-10-CM | POA: Diagnosis present

## 2011-12-06 DIAGNOSIS — N1330 Unspecified hydronephrosis: Secondary | ICD-10-CM | POA: Diagnosis not present

## 2011-12-06 DIAGNOSIS — R7881 Bacteremia: Secondary | ICD-10-CM | POA: Diagnosis not present

## 2011-12-06 DIAGNOSIS — I2510 Atherosclerotic heart disease of native coronary artery without angina pectoris: Secondary | ICD-10-CM | POA: Diagnosis present

## 2011-12-06 DIAGNOSIS — D509 Iron deficiency anemia, unspecified: Secondary | ICD-10-CM | POA: Diagnosis not present

## 2011-12-06 DIAGNOSIS — I82819 Embolism and thrombosis of superficial veins of unspecified lower extremities: Secondary | ICD-10-CM | POA: Diagnosis not present

## 2011-12-06 DIAGNOSIS — E789 Disorder of lipoprotein metabolism, unspecified: Secondary | ICD-10-CM | POA: Diagnosis not present

## 2011-12-06 DIAGNOSIS — R112 Nausea with vomiting, unspecified: Secondary | ICD-10-CM | POA: Diagnosis not present

## 2011-12-06 DIAGNOSIS — I339 Acute and subacute endocarditis, unspecified: Secondary | ICD-10-CM | POA: Diagnosis not present

## 2011-12-06 DIAGNOSIS — J90 Pleural effusion, not elsewhere classified: Secondary | ICD-10-CM | POA: Diagnosis not present

## 2011-12-06 DIAGNOSIS — H269 Unspecified cataract: Secondary | ICD-10-CM | POA: Diagnosis not present

## 2011-12-06 DIAGNOSIS — Z954 Presence of other heart-valve replacement: Secondary | ICD-10-CM | POA: Diagnosis not present

## 2011-12-06 DIAGNOSIS — N200 Calculus of kidney: Secondary | ICD-10-CM | POA: Diagnosis not present

## 2011-12-06 DIAGNOSIS — F329 Major depressive disorder, single episode, unspecified: Secondary | ICD-10-CM | POA: Diagnosis not present

## 2011-12-06 DIAGNOSIS — Z5189 Encounter for other specified aftercare: Secondary | ICD-10-CM | POA: Diagnosis not present

## 2011-12-06 DIAGNOSIS — N390 Urinary tract infection, site not specified: Secondary | ICD-10-CM | POA: Diagnosis not present

## 2011-12-06 DIAGNOSIS — R319 Hematuria, unspecified: Secondary | ICD-10-CM | POA: Diagnosis not present

## 2011-12-06 DIAGNOSIS — F411 Generalized anxiety disorder: Secondary | ICD-10-CM | POA: Diagnosis not present

## 2011-12-06 DIAGNOSIS — B952 Enterococcus as the cause of diseases classified elsewhere: Secondary | ICD-10-CM | POA: Diagnosis not present

## 2011-12-06 DIAGNOSIS — R9431 Abnormal electrocardiogram [ECG] [EKG]: Secondary | ICD-10-CM | POA: Diagnosis not present

## 2011-12-06 DIAGNOSIS — Z48812 Encounter for surgical aftercare following surgery on the circulatory system: Secondary | ICD-10-CM | POA: Diagnosis not present

## 2011-12-06 DIAGNOSIS — N186 End stage renal disease: Secondary | ICD-10-CM | POA: Diagnosis not present

## 2011-12-06 DIAGNOSIS — E039 Hypothyroidism, unspecified: Secondary | ICD-10-CM | POA: Diagnosis not present

## 2011-12-06 DIAGNOSIS — N949 Unspecified condition associated with female genital organs and menstrual cycle: Secondary | ICD-10-CM | POA: Diagnosis not present

## 2011-12-06 DIAGNOSIS — B954 Other streptococcus as the cause of diseases classified elsewhere: Secondary | ICD-10-CM | POA: Diagnosis not present

## 2011-12-06 DIAGNOSIS — I38 Endocarditis, valve unspecified: Secondary | ICD-10-CM | POA: Diagnosis not present

## 2011-12-06 DIAGNOSIS — D649 Anemia, unspecified: Secondary | ICD-10-CM | POA: Diagnosis not present

## 2011-12-06 DIAGNOSIS — E785 Hyperlipidemia, unspecified: Secondary | ICD-10-CM | POA: Diagnosis not present

## 2011-12-06 DIAGNOSIS — M7989 Other specified soft tissue disorders: Secondary | ICD-10-CM | POA: Diagnosis not present

## 2011-12-06 DIAGNOSIS — N135 Crossing vessel and stricture of ureter without hydronephrosis: Secondary | ICD-10-CM | POA: Diagnosis present

## 2011-12-06 DIAGNOSIS — N189 Chronic kidney disease, unspecified: Secondary | ICD-10-CM | POA: Diagnosis not present

## 2011-12-06 DIAGNOSIS — E875 Hyperkalemia: Secondary | ICD-10-CM | POA: Diagnosis not present

## 2011-12-06 DIAGNOSIS — R109 Unspecified abdominal pain: Secondary | ICD-10-CM | POA: Diagnosis not present

## 2011-12-06 DIAGNOSIS — H3530 Unspecified macular degeneration: Secondary | ICD-10-CM | POA: Diagnosis not present

## 2011-12-06 DIAGNOSIS — M129 Arthropathy, unspecified: Secondary | ICD-10-CM | POA: Diagnosis not present

## 2011-12-06 DIAGNOSIS — Z952 Presence of prosthetic heart valve: Secondary | ICD-10-CM | POA: Diagnosis not present

## 2011-12-06 DIAGNOSIS — Z79899 Other long term (current) drug therapy: Secondary | ICD-10-CM | POA: Diagnosis not present

## 2011-12-10 DIAGNOSIS — E785 Hyperlipidemia, unspecified: Secondary | ICD-10-CM | POA: Diagnosis not present

## 2011-12-10 DIAGNOSIS — E875 Hyperkalemia: Secondary | ICD-10-CM | POA: Diagnosis not present

## 2011-12-10 DIAGNOSIS — E039 Hypothyroidism, unspecified: Secondary | ICD-10-CM | POA: Diagnosis not present

## 2011-12-10 DIAGNOSIS — I38 Endocarditis, valve unspecified: Secondary | ICD-10-CM | POA: Diagnosis not present

## 2011-12-15 DIAGNOSIS — B954 Other streptococcus as the cause of diseases classified elsewhere: Secondary | ICD-10-CM | POA: Diagnosis not present

## 2011-12-15 DIAGNOSIS — I38 Endocarditis, valve unspecified: Secondary | ICD-10-CM | POA: Diagnosis not present

## 2011-12-15 DIAGNOSIS — R7881 Bacteremia: Secondary | ICD-10-CM | POA: Diagnosis not present

## 2011-12-15 DIAGNOSIS — I82819 Embolism and thrombosis of superficial veins of unspecified lower extremities: Secondary | ICD-10-CM | POA: Diagnosis not present

## 2011-12-15 DIAGNOSIS — B952 Enterococcus as the cause of diseases classified elsewhere: Secondary | ICD-10-CM | POA: Diagnosis not present

## 2011-12-16 DIAGNOSIS — I82819 Embolism and thrombosis of superficial veins of unspecified lower extremities: Secondary | ICD-10-CM | POA: Diagnosis not present

## 2011-12-16 DIAGNOSIS — R112 Nausea with vomiting, unspecified: Secondary | ICD-10-CM | POA: Diagnosis not present

## 2011-12-16 DIAGNOSIS — R109 Unspecified abdominal pain: Secondary | ICD-10-CM | POA: Diagnosis not present

## 2011-12-20 DIAGNOSIS — D509 Iron deficiency anemia, unspecified: Secondary | ICD-10-CM | POA: Diagnosis not present

## 2011-12-20 DIAGNOSIS — Z48812 Encounter for surgical aftercare following surgery on the circulatory system: Secondary | ICD-10-CM | POA: Diagnosis not present

## 2011-12-20 DIAGNOSIS — F329 Major depressive disorder, single episode, unspecified: Secondary | ICD-10-CM | POA: Diagnosis not present

## 2011-12-20 DIAGNOSIS — E789 Disorder of lipoprotein metabolism, unspecified: Secondary | ICD-10-CM | POA: Diagnosis not present

## 2011-12-20 DIAGNOSIS — I38 Endocarditis, valve unspecified: Secondary | ICD-10-CM | POA: Diagnosis not present

## 2011-12-20 DIAGNOSIS — I339 Acute and subacute endocarditis, unspecified: Secondary | ICD-10-CM | POA: Diagnosis not present

## 2011-12-20 DIAGNOSIS — H269 Unspecified cataract: Secondary | ICD-10-CM | POA: Diagnosis not present

## 2011-12-20 DIAGNOSIS — J90 Pleural effusion, not elsewhere classified: Secondary | ICD-10-CM | POA: Diagnosis not present

## 2011-12-20 DIAGNOSIS — N390 Urinary tract infection, site not specified: Secondary | ICD-10-CM | POA: Diagnosis not present

## 2011-12-20 DIAGNOSIS — Z5189 Encounter for other specified aftercare: Secondary | ICD-10-CM | POA: Diagnosis not present

## 2011-12-20 DIAGNOSIS — H3530 Unspecified macular degeneration: Secondary | ICD-10-CM | POA: Diagnosis not present

## 2011-12-20 DIAGNOSIS — R9431 Abnormal electrocardiogram [ECG] [EKG]: Secondary | ICD-10-CM | POA: Diagnosis not present

## 2011-12-20 DIAGNOSIS — M129 Arthropathy, unspecified: Secondary | ICD-10-CM | POA: Diagnosis not present

## 2011-12-20 DIAGNOSIS — N200 Calculus of kidney: Secondary | ICD-10-CM | POA: Diagnosis not present

## 2011-12-20 DIAGNOSIS — I82819 Embolism and thrombosis of superficial veins of unspecified lower extremities: Secondary | ICD-10-CM | POA: Diagnosis not present

## 2011-12-20 DIAGNOSIS — I2510 Atherosclerotic heart disease of native coronary artery without angina pectoris: Secondary | ICD-10-CM | POA: Diagnosis present

## 2011-12-20 DIAGNOSIS — R109 Unspecified abdominal pain: Secondary | ICD-10-CM | POA: Diagnosis not present

## 2011-12-20 DIAGNOSIS — Z954 Presence of other heart-valve replacement: Secondary | ICD-10-CM | POA: Diagnosis not present

## 2011-12-20 DIAGNOSIS — E039 Hypothyroidism, unspecified: Secondary | ICD-10-CM | POA: Diagnosis not present

## 2011-12-20 DIAGNOSIS — Z951 Presence of aortocoronary bypass graft: Secondary | ICD-10-CM | POA: Diagnosis not present

## 2011-12-20 DIAGNOSIS — F411 Generalized anxiety disorder: Secondary | ICD-10-CM | POA: Diagnosis not present

## 2011-12-20 DIAGNOSIS — N1330 Unspecified hydronephrosis: Secondary | ICD-10-CM | POA: Diagnosis not present

## 2011-12-20 DIAGNOSIS — N186 End stage renal disease: Secondary | ICD-10-CM | POA: Diagnosis not present

## 2011-12-20 DIAGNOSIS — N135 Crossing vessel and stricture of ureter without hydronephrosis: Secondary | ICD-10-CM | POA: Diagnosis not present

## 2011-12-20 DIAGNOSIS — R319 Hematuria, unspecified: Secondary | ICD-10-CM | POA: Diagnosis not present

## 2011-12-20 DIAGNOSIS — I509 Heart failure, unspecified: Secondary | ICD-10-CM | POA: Diagnosis not present

## 2011-12-22 DIAGNOSIS — H3530 Unspecified macular degeneration: Secondary | ICD-10-CM | POA: Diagnosis not present

## 2011-12-22 DIAGNOSIS — T819XXA Unspecified complication of procedure, initial encounter: Secondary | ICD-10-CM | POA: Diagnosis not present

## 2011-12-22 DIAGNOSIS — F411 Generalized anxiety disorder: Secondary | ICD-10-CM | POA: Diagnosis not present

## 2011-12-22 DIAGNOSIS — I749 Embolism and thrombosis of unspecified artery: Secondary | ICD-10-CM | POA: Diagnosis not present

## 2011-12-22 DIAGNOSIS — N135 Crossing vessel and stricture of ureter without hydronephrosis: Secondary | ICD-10-CM | POA: Diagnosis present

## 2011-12-22 DIAGNOSIS — N179 Acute kidney failure, unspecified: Secondary | ICD-10-CM | POA: Diagnosis not present

## 2011-12-22 DIAGNOSIS — N200 Calculus of kidney: Secondary | ICD-10-CM | POA: Diagnosis not present

## 2011-12-22 DIAGNOSIS — R1031 Right lower quadrant pain: Secondary | ICD-10-CM | POA: Diagnosis not present

## 2011-12-22 DIAGNOSIS — Z86718 Personal history of other venous thrombosis and embolism: Secondary | ICD-10-CM | POA: Diagnosis not present

## 2011-12-22 DIAGNOSIS — I2510 Atherosclerotic heart disease of native coronary artery without angina pectoris: Secondary | ICD-10-CM | POA: Diagnosis not present

## 2011-12-22 DIAGNOSIS — M129 Arthropathy, unspecified: Secondary | ICD-10-CM | POA: Diagnosis not present

## 2011-12-22 DIAGNOSIS — F329 Major depressive disorder, single episode, unspecified: Secondary | ICD-10-CM | POA: Diagnosis not present

## 2011-12-22 DIAGNOSIS — N184 Chronic kidney disease, stage 4 (severe): Secondary | ICD-10-CM | POA: Diagnosis not present

## 2011-12-22 DIAGNOSIS — I82409 Acute embolism and thrombosis of unspecified deep veins of unspecified lower extremity: Secondary | ICD-10-CM | POA: Diagnosis not present

## 2011-12-22 DIAGNOSIS — N1330 Unspecified hydronephrosis: Secondary | ICD-10-CM | POA: Diagnosis present

## 2011-12-22 DIAGNOSIS — D649 Anemia, unspecified: Secondary | ICD-10-CM | POA: Diagnosis not present

## 2011-12-22 DIAGNOSIS — H269 Unspecified cataract: Secondary | ICD-10-CM | POA: Diagnosis not present

## 2011-12-22 DIAGNOSIS — Z5189 Encounter for other specified aftercare: Secondary | ICD-10-CM | POA: Diagnosis not present

## 2011-12-22 DIAGNOSIS — Q628 Other congenital malformations of ureter: Secondary | ICD-10-CM | POA: Diagnosis not present

## 2011-12-22 DIAGNOSIS — E789 Disorder of lipoprotein metabolism, unspecified: Secondary | ICD-10-CM | POA: Diagnosis not present

## 2011-12-22 DIAGNOSIS — R319 Hematuria, unspecified: Secondary | ICD-10-CM | POA: Diagnosis not present

## 2011-12-22 DIAGNOSIS — J90 Pleural effusion, not elsewhere classified: Secondary | ICD-10-CM | POA: Diagnosis not present

## 2011-12-22 DIAGNOSIS — R109 Unspecified abdominal pain: Secondary | ICD-10-CM | POA: Diagnosis not present

## 2011-12-22 DIAGNOSIS — E039 Hypothyroidism, unspecified: Secondary | ICD-10-CM | POA: Diagnosis not present

## 2011-12-22 DIAGNOSIS — N189 Chronic kidney disease, unspecified: Secondary | ICD-10-CM | POA: Diagnosis not present

## 2011-12-22 DIAGNOSIS — IMO0002 Reserved for concepts with insufficient information to code with codable children: Secondary | ICD-10-CM | POA: Diagnosis present

## 2011-12-22 DIAGNOSIS — I82819 Embolism and thrombosis of superficial veins of unspecified lower extremities: Secondary | ICD-10-CM | POA: Diagnosis not present

## 2011-12-22 DIAGNOSIS — Z48812 Encounter for surgical aftercare following surgery on the circulatory system: Secondary | ICD-10-CM | POA: Diagnosis not present

## 2011-12-22 DIAGNOSIS — N183 Chronic kidney disease, stage 3 unspecified: Secondary | ICD-10-CM | POA: Diagnosis present

## 2011-12-22 DIAGNOSIS — N281 Cyst of kidney, acquired: Secondary | ICD-10-CM | POA: Diagnosis not present

## 2011-12-22 DIAGNOSIS — Z951 Presence of aortocoronary bypass graft: Secondary | ICD-10-CM | POA: Diagnosis not present

## 2011-12-22 DIAGNOSIS — D509 Iron deficiency anemia, unspecified: Secondary | ICD-10-CM | POA: Diagnosis present

## 2011-12-22 DIAGNOSIS — N390 Urinary tract infection, site not specified: Secondary | ICD-10-CM | POA: Diagnosis present

## 2011-12-23 DIAGNOSIS — D649 Anemia, unspecified: Secondary | ICD-10-CM | POA: Diagnosis not present

## 2011-12-23 DIAGNOSIS — N184 Chronic kidney disease, stage 4 (severe): Secondary | ICD-10-CM | POA: Diagnosis not present

## 2011-12-23 DIAGNOSIS — Q628 Other congenital malformations of ureter: Secondary | ICD-10-CM | POA: Diagnosis not present

## 2011-12-23 DIAGNOSIS — I82409 Acute embolism and thrombosis of unspecified deep veins of unspecified lower extremity: Secondary | ICD-10-CM | POA: Diagnosis not present

## 2011-12-26 DIAGNOSIS — D649 Anemia, unspecified: Secondary | ICD-10-CM | POA: Diagnosis not present

## 2011-12-26 DIAGNOSIS — N179 Acute kidney failure, unspecified: Secondary | ICD-10-CM | POA: Diagnosis not present

## 2011-12-26 DIAGNOSIS — Z48812 Encounter for surgical aftercare following surgery on the circulatory system: Secondary | ICD-10-CM | POA: Diagnosis not present

## 2011-12-26 DIAGNOSIS — N281 Cyst of kidney, acquired: Secondary | ICD-10-CM | POA: Diagnosis not present

## 2011-12-26 DIAGNOSIS — N189 Chronic kidney disease, unspecified: Secondary | ICD-10-CM | POA: Diagnosis not present

## 2011-12-26 DIAGNOSIS — E789 Disorder of lipoprotein metabolism, unspecified: Secondary | ICD-10-CM | POA: Diagnosis not present

## 2011-12-26 DIAGNOSIS — D508 Other iron deficiency anemias: Secondary | ICD-10-CM | POA: Diagnosis not present

## 2011-12-26 DIAGNOSIS — N139 Obstructive and reflux uropathy, unspecified: Secondary | ICD-10-CM | POA: Diagnosis not present

## 2011-12-26 DIAGNOSIS — J90 Pleural effusion, not elsewhere classified: Secondary | ICD-10-CM | POA: Diagnosis not present

## 2011-12-26 DIAGNOSIS — I824Z9 Acute embolism and thrombosis of unspecified deep veins of unspecified distal lower extremity: Secondary | ICD-10-CM | POA: Diagnosis not present

## 2011-12-26 DIAGNOSIS — N201 Calculus of ureter: Secondary | ICD-10-CM | POA: Diagnosis not present

## 2011-12-26 DIAGNOSIS — F411 Generalized anxiety disorder: Secondary | ICD-10-CM | POA: Diagnosis not present

## 2011-12-26 DIAGNOSIS — I2510 Atherosclerotic heart disease of native coronary artery without angina pectoris: Secondary | ICD-10-CM | POA: Diagnosis not present

## 2011-12-26 DIAGNOSIS — T819XXA Unspecified complication of procedure, initial encounter: Secondary | ICD-10-CM | POA: Diagnosis not present

## 2011-12-26 DIAGNOSIS — J9819 Other pulmonary collapse: Secondary | ICD-10-CM | POA: Diagnosis not present

## 2011-12-26 DIAGNOSIS — H3530 Unspecified macular degeneration: Secondary | ICD-10-CM | POA: Diagnosis not present

## 2011-12-26 DIAGNOSIS — F329 Major depressive disorder, single episode, unspecified: Secondary | ICD-10-CM | POA: Diagnosis not present

## 2011-12-26 DIAGNOSIS — Z5189 Encounter for other specified aftercare: Secondary | ICD-10-CM | POA: Diagnosis not present

## 2011-12-26 DIAGNOSIS — N390 Urinary tract infection, site not specified: Secondary | ICD-10-CM | POA: Diagnosis not present

## 2011-12-26 DIAGNOSIS — R1031 Right lower quadrant pain: Secondary | ICD-10-CM | POA: Diagnosis not present

## 2011-12-26 DIAGNOSIS — Z951 Presence of aortocoronary bypass graft: Secondary | ICD-10-CM | POA: Diagnosis not present

## 2011-12-26 DIAGNOSIS — E039 Hypothyroidism, unspecified: Secondary | ICD-10-CM | POA: Diagnosis not present

## 2011-12-26 DIAGNOSIS — Z86718 Personal history of other venous thrombosis and embolism: Secondary | ICD-10-CM | POA: Diagnosis not present

## 2011-12-26 DIAGNOSIS — I749 Embolism and thrombosis of unspecified artery: Secondary | ICD-10-CM | POA: Diagnosis not present

## 2011-12-26 DIAGNOSIS — D509 Iron deficiency anemia, unspecified: Secondary | ICD-10-CM | POA: Diagnosis not present

## 2011-12-26 DIAGNOSIS — IMO0002 Reserved for concepts with insufficient information to code with codable children: Secondary | ICD-10-CM | POA: Diagnosis not present

## 2011-12-26 DIAGNOSIS — E538 Deficiency of other specified B group vitamins: Secondary | ICD-10-CM | POA: Diagnosis not present

## 2011-12-26 DIAGNOSIS — R112 Nausea with vomiting, unspecified: Secondary | ICD-10-CM | POA: Diagnosis not present

## 2011-12-26 DIAGNOSIS — H269 Unspecified cataract: Secondary | ICD-10-CM | POA: Diagnosis not present

## 2011-12-26 DIAGNOSIS — I82819 Embolism and thrombosis of superficial veins of unspecified lower extremities: Secondary | ICD-10-CM | POA: Diagnosis not present

## 2011-12-26 DIAGNOSIS — I509 Heart failure, unspecified: Secondary | ICD-10-CM | POA: Diagnosis not present

## 2011-12-26 DIAGNOSIS — N200 Calculus of kidney: Secondary | ICD-10-CM | POA: Diagnosis not present

## 2011-12-26 DIAGNOSIS — N1330 Unspecified hydronephrosis: Secondary | ICD-10-CM | POA: Diagnosis present

## 2011-12-26 DIAGNOSIS — I82409 Acute embolism and thrombosis of unspecified deep veins of unspecified lower extremity: Secondary | ICD-10-CM | POA: Diagnosis not present

## 2011-12-26 DIAGNOSIS — N135 Crossing vessel and stricture of ureter without hydronephrosis: Secondary | ICD-10-CM | POA: Diagnosis not present

## 2011-12-26 DIAGNOSIS — M129 Arthropathy, unspecified: Secondary | ICD-10-CM | POA: Diagnosis not present

## 2011-12-26 DIAGNOSIS — R319 Hematuria, unspecified: Secondary | ICD-10-CM | POA: Diagnosis not present

## 2011-12-26 DIAGNOSIS — R109 Unspecified abdominal pain: Secondary | ICD-10-CM | POA: Diagnosis not present

## 2012-01-04 DIAGNOSIS — F411 Generalized anxiety disorder: Secondary | ICD-10-CM | POA: Diagnosis not present

## 2012-01-04 DIAGNOSIS — N184 Chronic kidney disease, stage 4 (severe): Secondary | ICD-10-CM | POA: Diagnosis not present

## 2012-01-04 DIAGNOSIS — N189 Chronic kidney disease, unspecified: Secondary | ICD-10-CM | POA: Diagnosis not present

## 2012-01-04 DIAGNOSIS — F329 Major depressive disorder, single episode, unspecified: Secondary | ICD-10-CM | POA: Diagnosis not present

## 2012-01-04 DIAGNOSIS — I82819 Embolism and thrombosis of superficial veins of unspecified lower extremities: Secondary | ICD-10-CM | POA: Diagnosis not present

## 2012-01-04 DIAGNOSIS — I38 Endocarditis, valve unspecified: Secondary | ICD-10-CM | POA: Diagnosis not present

## 2012-01-04 DIAGNOSIS — H269 Unspecified cataract: Secondary | ICD-10-CM | POA: Diagnosis not present

## 2012-01-04 DIAGNOSIS — Z954 Presence of other heart-valve replacement: Secondary | ICD-10-CM | POA: Diagnosis not present

## 2012-01-04 DIAGNOSIS — Z951 Presence of aortocoronary bypass graft: Secondary | ICD-10-CM | POA: Diagnosis not present

## 2012-01-04 DIAGNOSIS — E789 Disorder of lipoprotein metabolism, unspecified: Secondary | ICD-10-CM | POA: Diagnosis not present

## 2012-01-04 DIAGNOSIS — N2581 Secondary hyperparathyroidism of renal origin: Secondary | ICD-10-CM | POA: Diagnosis not present

## 2012-01-04 DIAGNOSIS — E039 Hypothyroidism, unspecified: Secondary | ICD-10-CM | POA: Diagnosis not present

## 2012-01-04 DIAGNOSIS — M129 Arthropathy, unspecified: Secondary | ICD-10-CM | POA: Diagnosis not present

## 2012-01-04 DIAGNOSIS — N1330 Unspecified hydronephrosis: Secondary | ICD-10-CM | POA: Diagnosis not present

## 2012-01-04 DIAGNOSIS — R7881 Bacteremia: Secondary | ICD-10-CM | POA: Diagnosis not present

## 2012-01-04 DIAGNOSIS — D509 Iron deficiency anemia, unspecified: Secondary | ICD-10-CM | POA: Diagnosis not present

## 2012-01-04 DIAGNOSIS — B952 Enterococcus as the cause of diseases classified elsewhere: Secondary | ICD-10-CM | POA: Diagnosis not present

## 2012-01-04 DIAGNOSIS — R319 Hematuria, unspecified: Secondary | ICD-10-CM | POA: Diagnosis not present

## 2012-01-04 DIAGNOSIS — I2510 Atherosclerotic heart disease of native coronary artery without angina pectoris: Secondary | ICD-10-CM | POA: Diagnosis not present

## 2012-01-04 DIAGNOSIS — R7989 Other specified abnormal findings of blood chemistry: Secondary | ICD-10-CM | POA: Diagnosis not present

## 2012-01-04 DIAGNOSIS — M6250 Muscle wasting and atrophy, not elsewhere classified, unspecified site: Secondary | ICD-10-CM | POA: Diagnosis not present

## 2012-01-04 DIAGNOSIS — R609 Edema, unspecified: Secondary | ICD-10-CM | POA: Diagnosis not present

## 2012-01-04 DIAGNOSIS — N390 Urinary tract infection, site not specified: Secondary | ICD-10-CM | POA: Diagnosis not present

## 2012-01-04 DIAGNOSIS — N179 Acute kidney failure, unspecified: Secondary | ICD-10-CM | POA: Diagnosis not present

## 2012-01-04 DIAGNOSIS — Z5189 Encounter for other specified aftercare: Secondary | ICD-10-CM | POA: Diagnosis not present

## 2012-01-04 DIAGNOSIS — I129 Hypertensive chronic kidney disease with stage 1 through stage 4 chronic kidney disease, or unspecified chronic kidney disease: Secondary | ICD-10-CM | POA: Diagnosis not present

## 2012-01-04 DIAGNOSIS — H3530 Unspecified macular degeneration: Secondary | ICD-10-CM | POA: Diagnosis not present

## 2012-01-04 DIAGNOSIS — B954 Other streptococcus as the cause of diseases classified elsewhere: Secondary | ICD-10-CM | POA: Diagnosis not present

## 2012-01-04 DIAGNOSIS — Z48812 Encounter for surgical aftercare following surgery on the circulatory system: Secondary | ICD-10-CM | POA: Diagnosis not present

## 2012-01-06 DIAGNOSIS — M6250 Muscle wasting and atrophy, not elsewhere classified, unspecified site: Secondary | ICD-10-CM | POA: Diagnosis not present

## 2012-01-06 DIAGNOSIS — I129 Hypertensive chronic kidney disease with stage 1 through stage 4 chronic kidney disease, or unspecified chronic kidney disease: Secondary | ICD-10-CM | POA: Diagnosis not present

## 2012-01-06 DIAGNOSIS — R609 Edema, unspecified: Secondary | ICD-10-CM | POA: Diagnosis not present

## 2012-01-06 DIAGNOSIS — N184 Chronic kidney disease, stage 4 (severe): Secondary | ICD-10-CM | POA: Diagnosis not present

## 2012-01-06 DIAGNOSIS — N1330 Unspecified hydronephrosis: Secondary | ICD-10-CM | POA: Diagnosis not present

## 2012-01-06 DIAGNOSIS — N2581 Secondary hyperparathyroidism of renal origin: Secondary | ICD-10-CM | POA: Diagnosis not present

## 2012-01-06 DIAGNOSIS — N390 Urinary tract infection, site not specified: Secondary | ICD-10-CM | POA: Diagnosis not present

## 2012-01-06 DIAGNOSIS — N189 Chronic kidney disease, unspecified: Secondary | ICD-10-CM | POA: Diagnosis not present

## 2012-01-13 DIAGNOSIS — Z954 Presence of other heart-valve replacement: Secondary | ICD-10-CM | POA: Diagnosis not present

## 2012-01-13 DIAGNOSIS — I38 Endocarditis, valve unspecified: Secondary | ICD-10-CM | POA: Diagnosis not present

## 2012-01-19 DIAGNOSIS — I38 Endocarditis, valve unspecified: Secondary | ICD-10-CM | POA: Diagnosis not present

## 2012-01-19 DIAGNOSIS — B954 Other streptococcus as the cause of diseases classified elsewhere: Secondary | ICD-10-CM | POA: Diagnosis not present

## 2012-01-19 DIAGNOSIS — R7881 Bacteremia: Secondary | ICD-10-CM | POA: Diagnosis not present

## 2012-01-19 DIAGNOSIS — B952 Enterococcus as the cause of diseases classified elsewhere: Secondary | ICD-10-CM | POA: Diagnosis not present

## 2012-01-20 DIAGNOSIS — I2510 Atherosclerotic heart disease of native coronary artery without angina pectoris: Secondary | ICD-10-CM | POA: Diagnosis not present

## 2012-01-20 DIAGNOSIS — R1031 Right lower quadrant pain: Secondary | ICD-10-CM | POA: Diagnosis not present

## 2012-01-20 DIAGNOSIS — Z86718 Personal history of other venous thrombosis and embolism: Secondary | ICD-10-CM | POA: Diagnosis not present

## 2012-01-20 DIAGNOSIS — Z951 Presence of aortocoronary bypass graft: Secondary | ICD-10-CM | POA: Diagnosis not present

## 2012-01-20 DIAGNOSIS — Z954 Presence of other heart-valve replacement: Secondary | ICD-10-CM | POA: Diagnosis not present

## 2012-01-24 DIAGNOSIS — N390 Urinary tract infection, site not specified: Secondary | ICD-10-CM | POA: Diagnosis not present

## 2012-01-24 DIAGNOSIS — N135 Crossing vessel and stricture of ureter without hydronephrosis: Secondary | ICD-10-CM | POA: Diagnosis not present

## 2012-01-24 DIAGNOSIS — N1330 Unspecified hydronephrosis: Secondary | ICD-10-CM | POA: Diagnosis not present

## 2012-01-25 DIAGNOSIS — Z954 Presence of other heart-valve replacement: Secondary | ICD-10-CM | POA: Diagnosis not present

## 2012-01-25 DIAGNOSIS — R1031 Right lower quadrant pain: Secondary | ICD-10-CM | POA: Diagnosis not present

## 2012-01-25 DIAGNOSIS — Z951 Presence of aortocoronary bypass graft: Secondary | ICD-10-CM | POA: Diagnosis not present

## 2012-01-25 DIAGNOSIS — I2510 Atherosclerotic heart disease of native coronary artery without angina pectoris: Secondary | ICD-10-CM | POA: Diagnosis not present

## 2012-01-25 DIAGNOSIS — Z86718 Personal history of other venous thrombosis and embolism: Secondary | ICD-10-CM | POA: Diagnosis not present

## 2012-01-28 DIAGNOSIS — Z86718 Personal history of other venous thrombosis and embolism: Secondary | ICD-10-CM | POA: Diagnosis not present

## 2012-01-28 DIAGNOSIS — R1031 Right lower quadrant pain: Secondary | ICD-10-CM | POA: Diagnosis not present

## 2012-01-28 DIAGNOSIS — I2510 Atherosclerotic heart disease of native coronary artery without angina pectoris: Secondary | ICD-10-CM | POA: Diagnosis not present

## 2012-01-28 DIAGNOSIS — Z951 Presence of aortocoronary bypass graft: Secondary | ICD-10-CM | POA: Diagnosis not present

## 2012-01-28 DIAGNOSIS — Z954 Presence of other heart-valve replacement: Secondary | ICD-10-CM | POA: Diagnosis not present

## 2012-01-31 DIAGNOSIS — I2510 Atherosclerotic heart disease of native coronary artery without angina pectoris: Secondary | ICD-10-CM | POA: Diagnosis not present

## 2012-01-31 DIAGNOSIS — Z86718 Personal history of other venous thrombosis and embolism: Secondary | ICD-10-CM | POA: Diagnosis not present

## 2012-01-31 DIAGNOSIS — Z954 Presence of other heart-valve replacement: Secondary | ICD-10-CM | POA: Diagnosis not present

## 2012-01-31 DIAGNOSIS — Z951 Presence of aortocoronary bypass graft: Secondary | ICD-10-CM | POA: Diagnosis not present

## 2012-01-31 DIAGNOSIS — R1031 Right lower quadrant pain: Secondary | ICD-10-CM | POA: Diagnosis not present

## 2012-02-02 DIAGNOSIS — Z86718 Personal history of other venous thrombosis and embolism: Secondary | ICD-10-CM | POA: Diagnosis not present

## 2012-02-02 DIAGNOSIS — R1031 Right lower quadrant pain: Secondary | ICD-10-CM | POA: Diagnosis not present

## 2012-02-02 DIAGNOSIS — Z954 Presence of other heart-valve replacement: Secondary | ICD-10-CM | POA: Diagnosis not present

## 2012-02-02 DIAGNOSIS — Z951 Presence of aortocoronary bypass graft: Secondary | ICD-10-CM | POA: Diagnosis not present

## 2012-02-02 DIAGNOSIS — I2510 Atherosclerotic heart disease of native coronary artery without angina pectoris: Secondary | ICD-10-CM | POA: Diagnosis not present

## 2012-02-04 DIAGNOSIS — E7800 Pure hypercholesterolemia, unspecified: Secondary | ICD-10-CM | POA: Diagnosis not present

## 2012-02-04 DIAGNOSIS — N184 Chronic kidney disease, stage 4 (severe): Secondary | ICD-10-CM | POA: Diagnosis not present

## 2012-02-04 DIAGNOSIS — I509 Heart failure, unspecified: Secondary | ICD-10-CM | POA: Diagnosis not present

## 2012-02-04 DIAGNOSIS — K219 Gastro-esophageal reflux disease without esophagitis: Secondary | ICD-10-CM | POA: Diagnosis not present

## 2012-02-04 DIAGNOSIS — E039 Hypothyroidism, unspecified: Secondary | ICD-10-CM | POA: Diagnosis not present

## 2012-02-07 DIAGNOSIS — I2510 Atherosclerotic heart disease of native coronary artery without angina pectoris: Secondary | ICD-10-CM | POA: Diagnosis not present

## 2012-02-07 DIAGNOSIS — R1031 Right lower quadrant pain: Secondary | ICD-10-CM | POA: Diagnosis not present

## 2012-02-07 DIAGNOSIS — Z951 Presence of aortocoronary bypass graft: Secondary | ICD-10-CM | POA: Diagnosis not present

## 2012-02-07 DIAGNOSIS — Z86718 Personal history of other venous thrombosis and embolism: Secondary | ICD-10-CM | POA: Diagnosis not present

## 2012-02-07 DIAGNOSIS — Z954 Presence of other heart-valve replacement: Secondary | ICD-10-CM | POA: Diagnosis not present

## 2012-02-08 DIAGNOSIS — Z951 Presence of aortocoronary bypass graft: Secondary | ICD-10-CM | POA: Diagnosis not present

## 2012-02-08 DIAGNOSIS — Z86718 Personal history of other venous thrombosis and embolism: Secondary | ICD-10-CM | POA: Diagnosis not present

## 2012-02-08 DIAGNOSIS — N189 Chronic kidney disease, unspecified: Secondary | ICD-10-CM | POA: Diagnosis not present

## 2012-02-08 DIAGNOSIS — N179 Acute kidney failure, unspecified: Secondary | ICD-10-CM | POA: Diagnosis not present

## 2012-02-08 DIAGNOSIS — R1031 Right lower quadrant pain: Secondary | ICD-10-CM | POA: Diagnosis not present

## 2012-02-08 DIAGNOSIS — Z954 Presence of other heart-valve replacement: Secondary | ICD-10-CM | POA: Diagnosis not present

## 2012-02-08 DIAGNOSIS — I2510 Atherosclerotic heart disease of native coronary artery without angina pectoris: Secondary | ICD-10-CM | POA: Diagnosis not present

## 2012-02-08 DIAGNOSIS — I509 Heart failure, unspecified: Secondary | ICD-10-CM | POA: Diagnosis not present

## 2012-02-09 DIAGNOSIS — I2510 Atherosclerotic heart disease of native coronary artery without angina pectoris: Secondary | ICD-10-CM | POA: Diagnosis not present

## 2012-02-09 DIAGNOSIS — Z954 Presence of other heart-valve replacement: Secondary | ICD-10-CM | POA: Diagnosis not present

## 2012-02-09 DIAGNOSIS — Z86718 Personal history of other venous thrombosis and embolism: Secondary | ICD-10-CM | POA: Diagnosis not present

## 2012-02-09 DIAGNOSIS — R1031 Right lower quadrant pain: Secondary | ICD-10-CM | POA: Diagnosis not present

## 2012-02-09 DIAGNOSIS — Z951 Presence of aortocoronary bypass graft: Secondary | ICD-10-CM | POA: Diagnosis not present

## 2012-02-11 DIAGNOSIS — I2510 Atherosclerotic heart disease of native coronary artery without angina pectoris: Secondary | ICD-10-CM | POA: Diagnosis not present

## 2012-02-11 DIAGNOSIS — Z86718 Personal history of other venous thrombosis and embolism: Secondary | ICD-10-CM | POA: Diagnosis not present

## 2012-02-11 DIAGNOSIS — Z954 Presence of other heart-valve replacement: Secondary | ICD-10-CM | POA: Diagnosis not present

## 2012-02-11 DIAGNOSIS — Z951 Presence of aortocoronary bypass graft: Secondary | ICD-10-CM | POA: Diagnosis not present

## 2012-02-11 DIAGNOSIS — R1031 Right lower quadrant pain: Secondary | ICD-10-CM | POA: Diagnosis not present

## 2012-02-14 DIAGNOSIS — Z954 Presence of other heart-valve replacement: Secondary | ICD-10-CM | POA: Diagnosis not present

## 2012-02-14 DIAGNOSIS — N200 Calculus of kidney: Secondary | ICD-10-CM | POA: Diagnosis not present

## 2012-02-14 DIAGNOSIS — N135 Crossing vessel and stricture of ureter without hydronephrosis: Secondary | ICD-10-CM | POA: Diagnosis not present

## 2012-02-14 DIAGNOSIS — Z951 Presence of aortocoronary bypass graft: Secondary | ICD-10-CM | POA: Diagnosis not present

## 2012-02-14 DIAGNOSIS — Z86718 Personal history of other venous thrombosis and embolism: Secondary | ICD-10-CM | POA: Diagnosis not present

## 2012-02-14 DIAGNOSIS — R1031 Right lower quadrant pain: Secondary | ICD-10-CM | POA: Diagnosis not present

## 2012-02-14 DIAGNOSIS — I2510 Atherosclerotic heart disease of native coronary artery without angina pectoris: Secondary | ICD-10-CM | POA: Diagnosis not present

## 2012-02-15 DIAGNOSIS — R1031 Right lower quadrant pain: Secondary | ICD-10-CM | POA: Diagnosis not present

## 2012-02-15 DIAGNOSIS — Z951 Presence of aortocoronary bypass graft: Secondary | ICD-10-CM | POA: Diagnosis not present

## 2012-02-15 DIAGNOSIS — I2510 Atherosclerotic heart disease of native coronary artery without angina pectoris: Secondary | ICD-10-CM | POA: Diagnosis not present

## 2012-02-15 DIAGNOSIS — Z86718 Personal history of other venous thrombosis and embolism: Secondary | ICD-10-CM | POA: Diagnosis not present

## 2012-02-15 DIAGNOSIS — Z954 Presence of other heart-valve replacement: Secondary | ICD-10-CM | POA: Diagnosis not present

## 2012-02-16 DIAGNOSIS — Z9889 Other specified postprocedural states: Secondary | ICD-10-CM | POA: Diagnosis not present

## 2012-02-16 DIAGNOSIS — I359 Nonrheumatic aortic valve disorder, unspecified: Secondary | ICD-10-CM | POA: Diagnosis not present

## 2012-02-16 DIAGNOSIS — I82819 Embolism and thrombosis of superficial veins of unspecified lower extremities: Secondary | ICD-10-CM | POA: Diagnosis not present

## 2012-02-16 DIAGNOSIS — E039 Hypothyroidism, unspecified: Secondary | ICD-10-CM | POA: Diagnosis not present

## 2012-02-16 DIAGNOSIS — F329 Major depressive disorder, single episode, unspecified: Secondary | ICD-10-CM | POA: Diagnosis present

## 2012-02-16 DIAGNOSIS — Z951 Presence of aortocoronary bypass graft: Secondary | ICD-10-CM | POA: Diagnosis not present

## 2012-02-16 DIAGNOSIS — I369 Nonrheumatic tricuspid valve disorder, unspecified: Secondary | ICD-10-CM | POA: Diagnosis not present

## 2012-02-16 DIAGNOSIS — Z7982 Long term (current) use of aspirin: Secondary | ICD-10-CM | POA: Diagnosis not present

## 2012-02-16 DIAGNOSIS — I951 Orthostatic hypotension: Secondary | ICD-10-CM | POA: Diagnosis not present

## 2012-02-16 DIAGNOSIS — N390 Urinary tract infection, site not specified: Secondary | ICD-10-CM | POA: Diagnosis not present

## 2012-02-16 DIAGNOSIS — E875 Hyperkalemia: Secondary | ICD-10-CM | POA: Diagnosis not present

## 2012-02-16 DIAGNOSIS — Z79899 Other long term (current) drug therapy: Secondary | ICD-10-CM | POA: Diagnosis not present

## 2012-02-16 DIAGNOSIS — J811 Chronic pulmonary edema: Secondary | ICD-10-CM | POA: Diagnosis not present

## 2012-02-16 DIAGNOSIS — R112 Nausea with vomiting, unspecified: Secondary | ICD-10-CM | POA: Diagnosis not present

## 2012-02-16 DIAGNOSIS — N200 Calculus of kidney: Secondary | ICD-10-CM | POA: Diagnosis not present

## 2012-02-16 DIAGNOSIS — N039 Chronic nephritic syndrome with unspecified morphologic changes: Secondary | ICD-10-CM | POA: Diagnosis present

## 2012-02-16 DIAGNOSIS — F411 Generalized anxiety disorder: Secondary | ICD-10-CM | POA: Diagnosis present

## 2012-02-16 DIAGNOSIS — Z792 Long term (current) use of antibiotics: Secondary | ICD-10-CM | POA: Diagnosis not present

## 2012-02-16 DIAGNOSIS — I959 Hypotension, unspecified: Secondary | ICD-10-CM | POA: Diagnosis not present

## 2012-02-16 DIAGNOSIS — N135 Crossing vessel and stricture of ureter without hydronephrosis: Secondary | ICD-10-CM | POA: Diagnosis not present

## 2012-02-16 DIAGNOSIS — D631 Anemia in chronic kidney disease: Secondary | ICD-10-CM | POA: Diagnosis present

## 2012-02-16 DIAGNOSIS — J90 Pleural effusion, not elsewhere classified: Secondary | ICD-10-CM | POA: Diagnosis not present

## 2012-02-16 DIAGNOSIS — R109 Unspecified abdominal pain: Secondary | ICD-10-CM | POA: Diagnosis not present

## 2012-02-16 DIAGNOSIS — E785 Hyperlipidemia, unspecified: Secondary | ICD-10-CM | POA: Diagnosis present

## 2012-02-16 DIAGNOSIS — Z881 Allergy status to other antibiotic agents status: Secondary | ICD-10-CM | POA: Diagnosis not present

## 2012-02-16 DIAGNOSIS — Z885 Allergy status to narcotic agent status: Secondary | ICD-10-CM | POA: Diagnosis not present

## 2012-02-16 DIAGNOSIS — Z882 Allergy status to sulfonamides status: Secondary | ICD-10-CM | POA: Diagnosis not present

## 2012-02-16 DIAGNOSIS — I9589 Other hypotension: Secondary | ICD-10-CM | POA: Diagnosis not present

## 2012-02-16 DIAGNOSIS — N1330 Unspecified hydronephrosis: Secondary | ICD-10-CM | POA: Diagnosis not present

## 2012-02-16 DIAGNOSIS — I509 Heart failure, unspecified: Secondary | ICD-10-CM | POA: Diagnosis present

## 2012-02-16 DIAGNOSIS — Z954 Presence of other heart-valve replacement: Secondary | ICD-10-CM | POA: Diagnosis not present

## 2012-02-17 DIAGNOSIS — R109 Unspecified abdominal pain: Secondary | ICD-10-CM | POA: Diagnosis not present

## 2012-02-17 DIAGNOSIS — N200 Calculus of kidney: Secondary | ICD-10-CM | POA: Diagnosis not present

## 2012-02-22 DIAGNOSIS — Z86718 Personal history of other venous thrombosis and embolism: Secondary | ICD-10-CM | POA: Diagnosis not present

## 2012-02-22 DIAGNOSIS — R1031 Right lower quadrant pain: Secondary | ICD-10-CM | POA: Diagnosis not present

## 2012-02-22 DIAGNOSIS — I2510 Atherosclerotic heart disease of native coronary artery without angina pectoris: Secondary | ICD-10-CM | POA: Diagnosis not present

## 2012-02-22 DIAGNOSIS — Z951 Presence of aortocoronary bypass graft: Secondary | ICD-10-CM | POA: Diagnosis not present

## 2012-02-22 DIAGNOSIS — Z954 Presence of other heart-valve replacement: Secondary | ICD-10-CM | POA: Diagnosis not present

## 2012-02-25 DIAGNOSIS — Z86718 Personal history of other venous thrombosis and embolism: Secondary | ICD-10-CM | POA: Diagnosis not present

## 2012-02-25 DIAGNOSIS — R1031 Right lower quadrant pain: Secondary | ICD-10-CM | POA: Diagnosis not present

## 2012-02-25 DIAGNOSIS — Z951 Presence of aortocoronary bypass graft: Secondary | ICD-10-CM | POA: Diagnosis not present

## 2012-02-25 DIAGNOSIS — I2510 Atherosclerotic heart disease of native coronary artery without angina pectoris: Secondary | ICD-10-CM | POA: Diagnosis not present

## 2012-02-25 DIAGNOSIS — Z954 Presence of other heart-valve replacement: Secondary | ICD-10-CM | POA: Diagnosis not present

## 2012-02-29 DIAGNOSIS — R1031 Right lower quadrant pain: Secondary | ICD-10-CM | POA: Diagnosis not present

## 2012-02-29 DIAGNOSIS — Z951 Presence of aortocoronary bypass graft: Secondary | ICD-10-CM | POA: Diagnosis not present

## 2012-02-29 DIAGNOSIS — Z954 Presence of other heart-valve replacement: Secondary | ICD-10-CM | POA: Diagnosis not present

## 2012-02-29 DIAGNOSIS — Z86718 Personal history of other venous thrombosis and embolism: Secondary | ICD-10-CM | POA: Diagnosis not present

## 2012-02-29 DIAGNOSIS — I2510 Atherosclerotic heart disease of native coronary artery without angina pectoris: Secondary | ICD-10-CM | POA: Diagnosis not present

## 2012-03-01 DIAGNOSIS — Z954 Presence of other heart-valve replacement: Secondary | ICD-10-CM | POA: Diagnosis not present

## 2012-03-01 DIAGNOSIS — I2510 Atherosclerotic heart disease of native coronary artery without angina pectoris: Secondary | ICD-10-CM | POA: Diagnosis not present

## 2012-03-01 DIAGNOSIS — R1031 Right lower quadrant pain: Secondary | ICD-10-CM | POA: Diagnosis not present

## 2012-03-01 DIAGNOSIS — Z951 Presence of aortocoronary bypass graft: Secondary | ICD-10-CM | POA: Diagnosis not present

## 2012-03-01 DIAGNOSIS — Z86718 Personal history of other venous thrombosis and embolism: Secondary | ICD-10-CM | POA: Diagnosis not present

## 2012-03-03 DIAGNOSIS — Z86718 Personal history of other venous thrombosis and embolism: Secondary | ICD-10-CM | POA: Diagnosis not present

## 2012-03-03 DIAGNOSIS — I2510 Atherosclerotic heart disease of native coronary artery without angina pectoris: Secondary | ICD-10-CM | POA: Diagnosis not present

## 2012-03-03 DIAGNOSIS — R1031 Right lower quadrant pain: Secondary | ICD-10-CM | POA: Diagnosis not present

## 2012-03-03 DIAGNOSIS — Z951 Presence of aortocoronary bypass graft: Secondary | ICD-10-CM | POA: Diagnosis not present

## 2012-03-03 DIAGNOSIS — Z954 Presence of other heart-valve replacement: Secondary | ICD-10-CM | POA: Diagnosis not present

## 2012-03-07 DIAGNOSIS — I959 Hypotension, unspecified: Secondary | ICD-10-CM | POA: Diagnosis not present

## 2012-03-07 DIAGNOSIS — I2510 Atherosclerotic heart disease of native coronary artery without angina pectoris: Secondary | ICD-10-CM | POA: Diagnosis not present

## 2012-03-07 DIAGNOSIS — Z954 Presence of other heart-valve replacement: Secondary | ICD-10-CM | POA: Diagnosis not present

## 2012-03-07 DIAGNOSIS — R5381 Other malaise: Secondary | ICD-10-CM | POA: Diagnosis not present

## 2012-03-07 DIAGNOSIS — R1031 Right lower quadrant pain: Secondary | ICD-10-CM | POA: Diagnosis not present

## 2012-03-07 DIAGNOSIS — Z951 Presence of aortocoronary bypass graft: Secondary | ICD-10-CM | POA: Diagnosis not present

## 2012-03-07 DIAGNOSIS — N390 Urinary tract infection, site not specified: Secondary | ICD-10-CM | POA: Diagnosis not present

## 2012-03-07 DIAGNOSIS — Z86718 Personal history of other venous thrombosis and embolism: Secondary | ICD-10-CM | POA: Diagnosis not present

## 2012-03-07 DIAGNOSIS — R5383 Other fatigue: Secondary | ICD-10-CM | POA: Diagnosis not present

## 2012-03-14 DIAGNOSIS — Z86718 Personal history of other venous thrombosis and embolism: Secondary | ICD-10-CM | POA: Diagnosis not present

## 2012-03-14 DIAGNOSIS — Z951 Presence of aortocoronary bypass graft: Secondary | ICD-10-CM | POA: Diagnosis not present

## 2012-03-14 DIAGNOSIS — Z954 Presence of other heart-valve replacement: Secondary | ICD-10-CM | POA: Diagnosis not present

## 2012-03-14 DIAGNOSIS — R1031 Right lower quadrant pain: Secondary | ICD-10-CM | POA: Diagnosis not present

## 2012-03-14 DIAGNOSIS — I2510 Atherosclerotic heart disease of native coronary artery without angina pectoris: Secondary | ICD-10-CM | POA: Diagnosis not present

## 2012-03-16 DIAGNOSIS — I951 Orthostatic hypotension: Secondary | ICD-10-CM | POA: Diagnosis not present

## 2012-03-16 DIAGNOSIS — T827XXA Infection and inflammatory reaction due to other cardiac and vascular devices, implants and grafts, initial encounter: Secondary | ICD-10-CM | POA: Diagnosis not present

## 2012-03-16 DIAGNOSIS — R002 Palpitations: Secondary | ICD-10-CM | POA: Diagnosis not present

## 2012-03-16 DIAGNOSIS — I359 Nonrheumatic aortic valve disorder, unspecified: Secondary | ICD-10-CM | POA: Diagnosis not present

## 2012-03-17 DIAGNOSIS — Z01818 Encounter for other preprocedural examination: Secondary | ICD-10-CM | POA: Diagnosis not present

## 2012-03-17 DIAGNOSIS — M7989 Other specified soft tissue disorders: Secondary | ICD-10-CM | POA: Diagnosis not present

## 2012-03-17 DIAGNOSIS — R918 Other nonspecific abnormal finding of lung field: Secondary | ICD-10-CM | POA: Diagnosis not present

## 2012-03-17 DIAGNOSIS — R9431 Abnormal electrocardiogram [ECG] [EKG]: Secondary | ICD-10-CM | POA: Diagnosis not present

## 2012-03-17 DIAGNOSIS — N183 Chronic kidney disease, stage 3 unspecified: Secondary | ICD-10-CM | POA: Diagnosis not present

## 2012-03-17 DIAGNOSIS — R0602 Shortness of breath: Secondary | ICD-10-CM | POA: Diagnosis not present

## 2012-03-17 DIAGNOSIS — E875 Hyperkalemia: Secondary | ICD-10-CM | POA: Diagnosis not present

## 2012-03-17 DIAGNOSIS — R5381 Other malaise: Secondary | ICD-10-CM | POA: Diagnosis not present

## 2012-03-17 DIAGNOSIS — N135 Crossing vessel and stricture of ureter without hydronephrosis: Secondary | ICD-10-CM | POA: Diagnosis not present

## 2012-03-17 DIAGNOSIS — N189 Chronic kidney disease, unspecified: Secondary | ICD-10-CM | POA: Diagnosis not present

## 2012-03-17 DIAGNOSIS — R011 Cardiac murmur, unspecified: Secondary | ICD-10-CM | POA: Diagnosis not present

## 2012-03-22 DIAGNOSIS — H5210 Myopia, unspecified eye: Secondary | ICD-10-CM | POA: Diagnosis not present

## 2012-03-22 DIAGNOSIS — I10 Essential (primary) hypertension: Secondary | ICD-10-CM | POA: Diagnosis not present

## 2012-03-22 DIAGNOSIS — H35369 Drusen (degenerative) of macula, unspecified eye: Secondary | ICD-10-CM | POA: Diagnosis not present

## 2012-03-22 DIAGNOSIS — N184 Chronic kidney disease, stage 4 (severe): Secondary | ICD-10-CM | POA: Diagnosis not present

## 2012-03-22 DIAGNOSIS — E213 Hyperparathyroidism, unspecified: Secondary | ICD-10-CM | POA: Diagnosis not present

## 2012-03-22 DIAGNOSIS — Z961 Presence of intraocular lens: Secondary | ICD-10-CM | POA: Diagnosis not present

## 2012-03-23 DIAGNOSIS — D649 Anemia, unspecified: Secondary | ICD-10-CM | POA: Diagnosis not present

## 2012-03-23 DIAGNOSIS — E875 Hyperkalemia: Secondary | ICD-10-CM | POA: Diagnosis not present

## 2012-03-23 DIAGNOSIS — N184 Chronic kidney disease, stage 4 (severe): Secondary | ICD-10-CM | POA: Diagnosis not present

## 2012-03-29 DIAGNOSIS — N189 Chronic kidney disease, unspecified: Secondary | ICD-10-CM | POA: Diagnosis not present

## 2012-04-18 DIAGNOSIS — D509 Iron deficiency anemia, unspecified: Secondary | ICD-10-CM | POA: Diagnosis not present

## 2012-04-24 DIAGNOSIS — N390 Urinary tract infection, site not specified: Secondary | ICD-10-CM | POA: Diagnosis not present

## 2012-04-24 DIAGNOSIS — N135 Crossing vessel and stricture of ureter without hydronephrosis: Secondary | ICD-10-CM | POA: Diagnosis not present

## 2012-04-24 DIAGNOSIS — N189 Chronic kidney disease, unspecified: Secondary | ICD-10-CM | POA: Diagnosis not present

## 2012-05-18 DIAGNOSIS — I951 Orthostatic hypotension: Secondary | ICD-10-CM | POA: Diagnosis not present

## 2012-05-18 DIAGNOSIS — I38 Endocarditis, valve unspecified: Secondary | ICD-10-CM | POA: Diagnosis not present

## 2012-05-18 DIAGNOSIS — I10 Essential (primary) hypertension: Secondary | ICD-10-CM | POA: Diagnosis not present

## 2012-05-18 DIAGNOSIS — N200 Calculus of kidney: Secondary | ICD-10-CM | POA: Diagnosis not present

## 2012-05-18 DIAGNOSIS — Z954 Presence of other heart-valve replacement: Secondary | ICD-10-CM | POA: Diagnosis not present

## 2012-05-18 DIAGNOSIS — I359 Nonrheumatic aortic valve disorder, unspecified: Secondary | ICD-10-CM | POA: Diagnosis not present

## 2012-06-02 DIAGNOSIS — N135 Crossing vessel and stricture of ureter without hydronephrosis: Secondary | ICD-10-CM | POA: Diagnosis not present

## 2012-06-02 DIAGNOSIS — N390 Urinary tract infection, site not specified: Secondary | ICD-10-CM | POA: Diagnosis not present

## 2012-06-02 DIAGNOSIS — E785 Hyperlipidemia, unspecified: Secondary | ICD-10-CM | POA: Diagnosis present

## 2012-06-02 DIAGNOSIS — Z79899 Other long term (current) drug therapy: Secondary | ICD-10-CM | POA: Diagnosis not present

## 2012-06-02 DIAGNOSIS — B3749 Other urogenital candidiasis: Secondary | ICD-10-CM | POA: Diagnosis present

## 2012-06-02 DIAGNOSIS — I491 Atrial premature depolarization: Secondary | ICD-10-CM | POA: Diagnosis not present

## 2012-06-02 DIAGNOSIS — B49 Unspecified mycosis: Secondary | ICD-10-CM | POA: Diagnosis not present

## 2012-06-02 DIAGNOSIS — N189 Chronic kidney disease, unspecified: Secondary | ICD-10-CM | POA: Diagnosis not present

## 2012-06-02 DIAGNOSIS — E039 Hypothyroidism, unspecified: Secondary | ICD-10-CM | POA: Diagnosis present

## 2012-06-02 DIAGNOSIS — F329 Major depressive disorder, single episode, unspecified: Secondary | ICD-10-CM | POA: Diagnosis present

## 2012-06-02 DIAGNOSIS — I509 Heart failure, unspecified: Secondary | ICD-10-CM | POA: Diagnosis not present

## 2012-06-02 DIAGNOSIS — H3530 Unspecified macular degeneration: Secondary | ICD-10-CM | POA: Diagnosis present

## 2012-06-02 DIAGNOSIS — Z5309 Procedure and treatment not carried out because of other contraindication: Secondary | ICD-10-CM | POA: Diagnosis not present

## 2012-06-02 DIAGNOSIS — B379 Candidiasis, unspecified: Secondary | ICD-10-CM | POA: Diagnosis not present

## 2012-06-19 ENCOUNTER — Ambulatory Visit: Payer: Self-pay | Admitting: Internal Medicine

## 2012-06-28 DIAGNOSIS — N1330 Unspecified hydronephrosis: Secondary | ICD-10-CM | POA: Diagnosis not present

## 2012-06-28 DIAGNOSIS — N135 Crossing vessel and stricture of ureter without hydronephrosis: Secondary | ICD-10-CM | POA: Diagnosis not present

## 2012-06-28 DIAGNOSIS — N390 Urinary tract infection, site not specified: Secondary | ICD-10-CM | POA: Diagnosis not present

## 2012-06-28 DIAGNOSIS — N189 Chronic kidney disease, unspecified: Secondary | ICD-10-CM | POA: Diagnosis not present

## 2012-07-12 DIAGNOSIS — Z954 Presence of other heart-valve replacement: Secondary | ICD-10-CM | POA: Diagnosis not present

## 2012-07-12 DIAGNOSIS — Z7982 Long term (current) use of aspirin: Secondary | ICD-10-CM | POA: Diagnosis not present

## 2012-07-12 DIAGNOSIS — Z951 Presence of aortocoronary bypass graft: Secondary | ICD-10-CM | POA: Diagnosis not present

## 2012-07-12 DIAGNOSIS — K219 Gastro-esophageal reflux disease without esophagitis: Secondary | ICD-10-CM | POA: Diagnosis not present

## 2012-07-12 DIAGNOSIS — I2510 Atherosclerotic heart disease of native coronary artery without angina pectoris: Secondary | ICD-10-CM | POA: Diagnosis not present

## 2012-07-12 DIAGNOSIS — Z881 Allergy status to other antibiotic agents status: Secondary | ICD-10-CM | POA: Diagnosis not present

## 2012-07-12 DIAGNOSIS — Z91018 Allergy to other foods: Secondary | ICD-10-CM | POA: Diagnosis not present

## 2012-07-12 DIAGNOSIS — N135 Crossing vessel and stricture of ureter without hydronephrosis: Secondary | ICD-10-CM | POA: Diagnosis not present

## 2012-07-12 DIAGNOSIS — I129 Hypertensive chronic kidney disease with stage 1 through stage 4 chronic kidney disease, or unspecified chronic kidney disease: Secondary | ICD-10-CM | POA: Diagnosis not present

## 2012-07-12 DIAGNOSIS — N1330 Unspecified hydronephrosis: Secondary | ICD-10-CM | POA: Diagnosis not present

## 2012-07-12 DIAGNOSIS — D509 Iron deficiency anemia, unspecified: Secondary | ICD-10-CM | POA: Diagnosis not present

## 2012-07-12 DIAGNOSIS — Z885 Allergy status to narcotic agent status: Secondary | ICD-10-CM | POA: Diagnosis not present

## 2012-07-12 DIAGNOSIS — F329 Major depressive disorder, single episode, unspecified: Secondary | ICD-10-CM | POA: Diagnosis not present

## 2012-07-12 DIAGNOSIS — E039 Hypothyroidism, unspecified: Secondary | ICD-10-CM | POA: Diagnosis not present

## 2012-07-18 DIAGNOSIS — N135 Crossing vessel and stricture of ureter without hydronephrosis: Secondary | ICD-10-CM | POA: Diagnosis not present

## 2012-07-18 DIAGNOSIS — N1330 Unspecified hydronephrosis: Secondary | ICD-10-CM | POA: Diagnosis not present

## 2012-07-18 DIAGNOSIS — K219 Gastro-esophageal reflux disease without esophagitis: Secondary | ICD-10-CM | POA: Diagnosis not present

## 2012-07-18 DIAGNOSIS — E039 Hypothyroidism, unspecified: Secondary | ICD-10-CM | POA: Diagnosis not present

## 2012-07-18 DIAGNOSIS — I129 Hypertensive chronic kidney disease with stage 1 through stage 4 chronic kidney disease, or unspecified chronic kidney disease: Secondary | ICD-10-CM | POA: Diagnosis not present

## 2012-07-18 DIAGNOSIS — Z466 Encounter for fitting and adjustment of urinary device: Secondary | ICD-10-CM | POA: Diagnosis not present

## 2012-07-28 DIAGNOSIS — H3530 Unspecified macular degeneration: Secondary | ICD-10-CM | POA: Diagnosis not present

## 2012-07-28 DIAGNOSIS — H5210 Myopia, unspecified eye: Secondary | ICD-10-CM | POA: Diagnosis not present

## 2012-07-28 DIAGNOSIS — H35369 Drusen (degenerative) of macula, unspecified eye: Secondary | ICD-10-CM | POA: Diagnosis not present

## 2012-08-07 DIAGNOSIS — Z9181 History of falling: Secondary | ICD-10-CM | POA: Diagnosis not present

## 2012-08-07 DIAGNOSIS — Z6825 Body mass index (BMI) 25.0-25.9, adult: Secondary | ICD-10-CM | POA: Diagnosis not present

## 2012-08-07 DIAGNOSIS — N390 Urinary tract infection, site not specified: Secondary | ICD-10-CM | POA: Diagnosis not present

## 2012-08-07 DIAGNOSIS — R61 Generalized hyperhidrosis: Secondary | ICD-10-CM | POA: Diagnosis not present

## 2012-08-09 DIAGNOSIS — N135 Crossing vessel and stricture of ureter without hydronephrosis: Secondary | ICD-10-CM | POA: Diagnosis not present

## 2012-08-09 DIAGNOSIS — N200 Calculus of kidney: Secondary | ICD-10-CM | POA: Diagnosis not present

## 2012-08-09 DIAGNOSIS — N390 Urinary tract infection, site not specified: Secondary | ICD-10-CM | POA: Diagnosis not present

## 2012-08-21 DIAGNOSIS — N390 Urinary tract infection, site not specified: Secondary | ICD-10-CM | POA: Diagnosis not present

## 2012-08-21 DIAGNOSIS — N135 Crossing vessel and stricture of ureter without hydronephrosis: Secondary | ICD-10-CM | POA: Diagnosis not present

## 2012-09-06 DIAGNOSIS — N390 Urinary tract infection, site not specified: Secondary | ICD-10-CM | POA: Diagnosis not present

## 2012-09-06 DIAGNOSIS — N289 Disorder of kidney and ureter, unspecified: Secondary | ICD-10-CM | POA: Diagnosis not present

## 2012-09-06 DIAGNOSIS — N135 Crossing vessel and stricture of ureter without hydronephrosis: Secondary | ICD-10-CM | POA: Diagnosis not present

## 2012-09-13 DIAGNOSIS — N135 Crossing vessel and stricture of ureter without hydronephrosis: Secondary | ICD-10-CM | POA: Diagnosis not present

## 2012-09-13 DIAGNOSIS — R791 Abnormal coagulation profile: Secondary | ICD-10-CM | POA: Diagnosis not present

## 2012-09-13 DIAGNOSIS — R9389 Abnormal findings on diagnostic imaging of other specified body structures: Secondary | ICD-10-CM | POA: Diagnosis not present

## 2012-09-13 DIAGNOSIS — N1330 Unspecified hydronephrosis: Secondary | ICD-10-CM | POA: Diagnosis not present

## 2012-09-13 DIAGNOSIS — N390 Urinary tract infection, site not specified: Secondary | ICD-10-CM | POA: Diagnosis not present

## 2012-09-14 DIAGNOSIS — R0609 Other forms of dyspnea: Secondary | ICD-10-CM | POA: Diagnosis not present

## 2012-09-14 DIAGNOSIS — R0989 Other specified symptoms and signs involving the circulatory and respiratory systems: Secondary | ICD-10-CM | POA: Diagnosis not present

## 2012-09-14 DIAGNOSIS — I369 Nonrheumatic tricuspid valve disorder, unspecified: Secondary | ICD-10-CM | POA: Diagnosis not present

## 2012-10-04 DIAGNOSIS — Z9089 Acquired absence of other organs: Secondary | ICD-10-CM | POA: Diagnosis not present

## 2012-10-04 DIAGNOSIS — N1330 Unspecified hydronephrosis: Secondary | ICD-10-CM | POA: Diagnosis present

## 2012-10-04 DIAGNOSIS — N184 Chronic kidney disease, stage 4 (severe): Secondary | ICD-10-CM | POA: Diagnosis present

## 2012-10-04 DIAGNOSIS — Z9889 Other specified postprocedural states: Secondary | ICD-10-CM | POA: Diagnosis not present

## 2012-10-04 DIAGNOSIS — I509 Heart failure, unspecified: Secondary | ICD-10-CM | POA: Diagnosis present

## 2012-10-04 DIAGNOSIS — F411 Generalized anxiety disorder: Secondary | ICD-10-CM | POA: Diagnosis present

## 2012-10-04 DIAGNOSIS — D509 Iron deficiency anemia, unspecified: Secondary | ICD-10-CM | POA: Diagnosis present

## 2012-10-04 DIAGNOSIS — E039 Hypothyroidism, unspecified: Secondary | ICD-10-CM | POA: Diagnosis present

## 2012-10-04 DIAGNOSIS — Z9849 Cataract extraction status, unspecified eye: Secondary | ICD-10-CM | POA: Diagnosis not present

## 2012-10-04 DIAGNOSIS — R109 Unspecified abdominal pain: Secondary | ICD-10-CM | POA: Diagnosis not present

## 2012-10-04 DIAGNOSIS — Z7982 Long term (current) use of aspirin: Secondary | ICD-10-CM | POA: Diagnosis not present

## 2012-10-04 DIAGNOSIS — Z87440 Personal history of urinary (tract) infections: Secondary | ICD-10-CM | POA: Diagnosis not present

## 2012-10-04 DIAGNOSIS — H353190 Nonexudative age-related macular degeneration, unspecified eye, stage unspecified: Secondary | ICD-10-CM | POA: Diagnosis present

## 2012-10-04 DIAGNOSIS — R5381 Other malaise: Secondary | ICD-10-CM | POA: Diagnosis present

## 2012-10-04 DIAGNOSIS — Z466 Encounter for fitting and adjustment of urinary device: Secondary | ICD-10-CM | POA: Diagnosis not present

## 2012-10-04 DIAGNOSIS — Z961 Presence of intraocular lens: Secondary | ICD-10-CM | POA: Diagnosis not present

## 2012-10-04 DIAGNOSIS — F329 Major depressive disorder, single episode, unspecified: Secondary | ICD-10-CM | POA: Diagnosis present

## 2012-10-04 DIAGNOSIS — N135 Crossing vessel and stricture of ureter without hydronephrosis: Secondary | ICD-10-CM | POA: Diagnosis not present

## 2012-10-04 DIAGNOSIS — K5900 Constipation, unspecified: Secondary | ICD-10-CM | POA: Diagnosis present

## 2012-10-04 DIAGNOSIS — E785 Hyperlipidemia, unspecified: Secondary | ICD-10-CM | POA: Diagnosis present

## 2012-10-05 DIAGNOSIS — N135 Crossing vessel and stricture of ureter without hydronephrosis: Secondary | ICD-10-CM | POA: Diagnosis not present

## 2012-10-05 DIAGNOSIS — R109 Unspecified abdominal pain: Secondary | ICD-10-CM | POA: Diagnosis not present

## 2012-10-09 DIAGNOSIS — D509 Iron deficiency anemia, unspecified: Secondary | ICD-10-CM | POA: Diagnosis not present

## 2012-10-09 DIAGNOSIS — M159 Polyosteoarthritis, unspecified: Secondary | ICD-10-CM | POA: Diagnosis not present

## 2012-10-09 DIAGNOSIS — I509 Heart failure, unspecified: Secondary | ICD-10-CM | POA: Diagnosis not present

## 2012-10-09 DIAGNOSIS — N189 Chronic kidney disease, unspecified: Secondary | ICD-10-CM | POA: Diagnosis not present

## 2012-10-09 DIAGNOSIS — Z436 Encounter for attention to other artificial openings of urinary tract: Secondary | ICD-10-CM | POA: Diagnosis not present

## 2012-10-09 DIAGNOSIS — N135 Crossing vessel and stricture of ureter without hydronephrosis: Secondary | ICD-10-CM | POA: Diagnosis not present

## 2012-10-11 DIAGNOSIS — N135 Crossing vessel and stricture of ureter without hydronephrosis: Secondary | ICD-10-CM | POA: Diagnosis not present

## 2012-10-11 DIAGNOSIS — N189 Chronic kidney disease, unspecified: Secondary | ICD-10-CM | POA: Diagnosis not present

## 2012-10-11 DIAGNOSIS — Z436 Encounter for attention to other artificial openings of urinary tract: Secondary | ICD-10-CM | POA: Diagnosis not present

## 2012-10-11 DIAGNOSIS — I509 Heart failure, unspecified: Secondary | ICD-10-CM | POA: Diagnosis not present

## 2012-10-11 DIAGNOSIS — D509 Iron deficiency anemia, unspecified: Secondary | ICD-10-CM | POA: Diagnosis not present

## 2012-10-11 DIAGNOSIS — M159 Polyosteoarthritis, unspecified: Secondary | ICD-10-CM | POA: Diagnosis not present

## 2012-10-13 DIAGNOSIS — N189 Chronic kidney disease, unspecified: Secondary | ICD-10-CM | POA: Diagnosis not present

## 2012-10-13 DIAGNOSIS — Z436 Encounter for attention to other artificial openings of urinary tract: Secondary | ICD-10-CM | POA: Diagnosis not present

## 2012-10-13 DIAGNOSIS — D509 Iron deficiency anemia, unspecified: Secondary | ICD-10-CM | POA: Diagnosis not present

## 2012-10-13 DIAGNOSIS — M159 Polyosteoarthritis, unspecified: Secondary | ICD-10-CM | POA: Diagnosis not present

## 2012-10-13 DIAGNOSIS — N135 Crossing vessel and stricture of ureter without hydronephrosis: Secondary | ICD-10-CM | POA: Diagnosis not present

## 2012-10-13 DIAGNOSIS — I509 Heart failure, unspecified: Secondary | ICD-10-CM | POA: Diagnosis not present

## 2012-10-15 DIAGNOSIS — I509 Heart failure, unspecified: Secondary | ICD-10-CM | POA: Diagnosis not present

## 2012-10-15 DIAGNOSIS — Z436 Encounter for attention to other artificial openings of urinary tract: Secondary | ICD-10-CM | POA: Diagnosis not present

## 2012-10-15 DIAGNOSIS — N135 Crossing vessel and stricture of ureter without hydronephrosis: Secondary | ICD-10-CM | POA: Diagnosis not present

## 2012-10-15 DIAGNOSIS — N189 Chronic kidney disease, unspecified: Secondary | ICD-10-CM | POA: Diagnosis not present

## 2012-10-15 DIAGNOSIS — M159 Polyosteoarthritis, unspecified: Secondary | ICD-10-CM | POA: Diagnosis not present

## 2012-10-15 DIAGNOSIS — D509 Iron deficiency anemia, unspecified: Secondary | ICD-10-CM | POA: Diagnosis not present

## 2012-10-16 DIAGNOSIS — Q6239 Other obstructive defects of renal pelvis and ureter: Secondary | ICD-10-CM | POA: Diagnosis not present

## 2012-10-16 DIAGNOSIS — N390 Urinary tract infection, site not specified: Secondary | ICD-10-CM | POA: Diagnosis not present

## 2012-10-16 DIAGNOSIS — Z9889 Other specified postprocedural states: Secondary | ICD-10-CM | POA: Diagnosis not present

## 2012-10-16 DIAGNOSIS — B379 Candidiasis, unspecified: Secondary | ICD-10-CM | POA: Diagnosis not present

## 2012-10-16 DIAGNOSIS — N183 Chronic kidney disease, stage 3 unspecified: Secondary | ICD-10-CM | POA: Diagnosis not present

## 2012-10-16 DIAGNOSIS — N135 Crossing vessel and stricture of ureter without hydronephrosis: Secondary | ICD-10-CM | POA: Diagnosis not present

## 2012-10-17 DIAGNOSIS — I509 Heart failure, unspecified: Secondary | ICD-10-CM | POA: Diagnosis not present

## 2012-10-17 DIAGNOSIS — N189 Chronic kidney disease, unspecified: Secondary | ICD-10-CM | POA: Diagnosis not present

## 2012-10-17 DIAGNOSIS — M159 Polyosteoarthritis, unspecified: Secondary | ICD-10-CM | POA: Diagnosis not present

## 2012-10-17 DIAGNOSIS — N135 Crossing vessel and stricture of ureter without hydronephrosis: Secondary | ICD-10-CM | POA: Diagnosis not present

## 2012-10-17 DIAGNOSIS — D509 Iron deficiency anemia, unspecified: Secondary | ICD-10-CM | POA: Diagnosis not present

## 2012-10-17 DIAGNOSIS — Z436 Encounter for attention to other artificial openings of urinary tract: Secondary | ICD-10-CM | POA: Diagnosis not present

## 2012-10-19 DIAGNOSIS — Z436 Encounter for attention to other artificial openings of urinary tract: Secondary | ICD-10-CM | POA: Diagnosis not present

## 2012-10-19 DIAGNOSIS — I509 Heart failure, unspecified: Secondary | ICD-10-CM | POA: Diagnosis not present

## 2012-10-19 DIAGNOSIS — M159 Polyosteoarthritis, unspecified: Secondary | ICD-10-CM | POA: Diagnosis not present

## 2012-10-19 DIAGNOSIS — N135 Crossing vessel and stricture of ureter without hydronephrosis: Secondary | ICD-10-CM | POA: Diagnosis not present

## 2012-10-19 DIAGNOSIS — D509 Iron deficiency anemia, unspecified: Secondary | ICD-10-CM | POA: Diagnosis not present

## 2012-10-19 DIAGNOSIS — N189 Chronic kidney disease, unspecified: Secondary | ICD-10-CM | POA: Diagnosis not present

## 2012-10-23 DIAGNOSIS — I509 Heart failure, unspecified: Secondary | ICD-10-CM | POA: Diagnosis not present

## 2012-10-23 DIAGNOSIS — N189 Chronic kidney disease, unspecified: Secondary | ICD-10-CM | POA: Diagnosis not present

## 2012-10-23 DIAGNOSIS — D509 Iron deficiency anemia, unspecified: Secondary | ICD-10-CM | POA: Diagnosis not present

## 2012-10-23 DIAGNOSIS — Z436 Encounter for attention to other artificial openings of urinary tract: Secondary | ICD-10-CM | POA: Diagnosis not present

## 2012-10-23 DIAGNOSIS — M159 Polyosteoarthritis, unspecified: Secondary | ICD-10-CM | POA: Diagnosis not present

## 2012-10-23 DIAGNOSIS — N135 Crossing vessel and stricture of ureter without hydronephrosis: Secondary | ICD-10-CM | POA: Diagnosis not present

## 2012-10-24 DIAGNOSIS — N2889 Other specified disorders of kidney and ureter: Secondary | ICD-10-CM | POA: Diagnosis not present

## 2012-10-24 DIAGNOSIS — N135 Crossing vessel and stricture of ureter without hydronephrosis: Secondary | ICD-10-CM | POA: Diagnosis not present

## 2012-10-24 DIAGNOSIS — I509 Heart failure, unspecified: Secondary | ICD-10-CM | POA: Diagnosis not present

## 2012-10-24 DIAGNOSIS — R279 Unspecified lack of coordination: Secondary | ICD-10-CM | POA: Diagnosis not present

## 2012-10-25 DIAGNOSIS — I509 Heart failure, unspecified: Secondary | ICD-10-CM | POA: Diagnosis not present

## 2012-10-25 DIAGNOSIS — N135 Crossing vessel and stricture of ureter without hydronephrosis: Secondary | ICD-10-CM | POA: Diagnosis not present

## 2012-10-25 DIAGNOSIS — B379 Candidiasis, unspecified: Secondary | ICD-10-CM | POA: Diagnosis not present

## 2012-10-25 DIAGNOSIS — N2889 Other specified disorders of kidney and ureter: Secondary | ICD-10-CM | POA: Diagnosis not present

## 2012-10-25 DIAGNOSIS — R279 Unspecified lack of coordination: Secondary | ICD-10-CM | POA: Diagnosis not present

## 2012-10-27 DIAGNOSIS — Z436 Encounter for attention to other artificial openings of urinary tract: Secondary | ICD-10-CM | POA: Diagnosis not present

## 2012-10-27 DIAGNOSIS — D509 Iron deficiency anemia, unspecified: Secondary | ICD-10-CM | POA: Diagnosis not present

## 2012-10-27 DIAGNOSIS — N189 Chronic kidney disease, unspecified: Secondary | ICD-10-CM | POA: Diagnosis not present

## 2012-10-27 DIAGNOSIS — I509 Heart failure, unspecified: Secondary | ICD-10-CM | POA: Diagnosis not present

## 2012-10-27 DIAGNOSIS — M159 Polyosteoarthritis, unspecified: Secondary | ICD-10-CM | POA: Diagnosis not present

## 2012-10-27 DIAGNOSIS — N135 Crossing vessel and stricture of ureter without hydronephrosis: Secondary | ICD-10-CM | POA: Diagnosis not present

## 2012-10-30 DIAGNOSIS — Z436 Encounter for attention to other artificial openings of urinary tract: Secondary | ICD-10-CM | POA: Diagnosis not present

## 2012-10-30 DIAGNOSIS — M159 Polyosteoarthritis, unspecified: Secondary | ICD-10-CM | POA: Diagnosis not present

## 2012-10-30 DIAGNOSIS — N135 Crossing vessel and stricture of ureter without hydronephrosis: Secondary | ICD-10-CM | POA: Diagnosis not present

## 2012-10-30 DIAGNOSIS — B6790 Echinococcosis, unspecified: Secondary | ICD-10-CM | POA: Diagnosis not present

## 2012-10-30 DIAGNOSIS — N189 Chronic kidney disease, unspecified: Secondary | ICD-10-CM | POA: Diagnosis not present

## 2012-10-30 DIAGNOSIS — N390 Urinary tract infection, site not specified: Secondary | ICD-10-CM | POA: Diagnosis not present

## 2012-10-30 DIAGNOSIS — I509 Heart failure, unspecified: Secondary | ICD-10-CM | POA: Diagnosis not present

## 2012-10-30 DIAGNOSIS — D509 Iron deficiency anemia, unspecified: Secondary | ICD-10-CM | POA: Diagnosis not present

## 2012-11-01 DIAGNOSIS — D509 Iron deficiency anemia, unspecified: Secondary | ICD-10-CM | POA: Diagnosis not present

## 2012-11-01 DIAGNOSIS — Z436 Encounter for attention to other artificial openings of urinary tract: Secondary | ICD-10-CM | POA: Diagnosis not present

## 2012-11-01 DIAGNOSIS — N135 Crossing vessel and stricture of ureter without hydronephrosis: Secondary | ICD-10-CM | POA: Diagnosis not present

## 2012-11-01 DIAGNOSIS — M159 Polyosteoarthritis, unspecified: Secondary | ICD-10-CM | POA: Diagnosis not present

## 2012-11-01 DIAGNOSIS — I509 Heart failure, unspecified: Secondary | ICD-10-CM | POA: Diagnosis not present

## 2012-11-01 DIAGNOSIS — N189 Chronic kidney disease, unspecified: Secondary | ICD-10-CM | POA: Diagnosis not present

## 2012-11-03 DIAGNOSIS — D509 Iron deficiency anemia, unspecified: Secondary | ICD-10-CM | POA: Diagnosis not present

## 2012-11-03 DIAGNOSIS — I509 Heart failure, unspecified: Secondary | ICD-10-CM | POA: Diagnosis not present

## 2012-11-03 DIAGNOSIS — N189 Chronic kidney disease, unspecified: Secondary | ICD-10-CM | POA: Diagnosis not present

## 2012-11-03 DIAGNOSIS — N135 Crossing vessel and stricture of ureter without hydronephrosis: Secondary | ICD-10-CM | POA: Diagnosis not present

## 2012-11-03 DIAGNOSIS — Z436 Encounter for attention to other artificial openings of urinary tract: Secondary | ICD-10-CM | POA: Diagnosis not present

## 2012-11-03 DIAGNOSIS — M159 Polyosteoarthritis, unspecified: Secondary | ICD-10-CM | POA: Diagnosis not present

## 2012-11-06 DIAGNOSIS — Z436 Encounter for attention to other artificial openings of urinary tract: Secondary | ICD-10-CM | POA: Diagnosis not present

## 2012-11-06 DIAGNOSIS — N189 Chronic kidney disease, unspecified: Secondary | ICD-10-CM | POA: Diagnosis not present

## 2012-11-06 DIAGNOSIS — D509 Iron deficiency anemia, unspecified: Secondary | ICD-10-CM | POA: Diagnosis not present

## 2012-11-06 DIAGNOSIS — M159 Polyosteoarthritis, unspecified: Secondary | ICD-10-CM | POA: Diagnosis not present

## 2012-11-06 DIAGNOSIS — I509 Heart failure, unspecified: Secondary | ICD-10-CM | POA: Diagnosis not present

## 2012-11-06 DIAGNOSIS — N135 Crossing vessel and stricture of ureter without hydronephrosis: Secondary | ICD-10-CM | POA: Diagnosis not present

## 2012-11-08 DIAGNOSIS — N189 Chronic kidney disease, unspecified: Secondary | ICD-10-CM | POA: Diagnosis not present

## 2012-11-08 DIAGNOSIS — D509 Iron deficiency anemia, unspecified: Secondary | ICD-10-CM | POA: Diagnosis not present

## 2012-11-08 DIAGNOSIS — Z436 Encounter for attention to other artificial openings of urinary tract: Secondary | ICD-10-CM | POA: Diagnosis not present

## 2012-11-08 DIAGNOSIS — N135 Crossing vessel and stricture of ureter without hydronephrosis: Secondary | ICD-10-CM | POA: Diagnosis not present

## 2012-11-08 DIAGNOSIS — I509 Heart failure, unspecified: Secondary | ICD-10-CM | POA: Diagnosis not present

## 2012-11-08 DIAGNOSIS — M159 Polyosteoarthritis, unspecified: Secondary | ICD-10-CM | POA: Diagnosis not present

## 2012-11-10 DIAGNOSIS — N135 Crossing vessel and stricture of ureter without hydronephrosis: Secondary | ICD-10-CM | POA: Diagnosis not present

## 2012-11-10 DIAGNOSIS — N189 Chronic kidney disease, unspecified: Secondary | ICD-10-CM | POA: Diagnosis not present

## 2012-11-10 DIAGNOSIS — M159 Polyosteoarthritis, unspecified: Secondary | ICD-10-CM | POA: Diagnosis not present

## 2012-11-10 DIAGNOSIS — D509 Iron deficiency anemia, unspecified: Secondary | ICD-10-CM | POA: Diagnosis not present

## 2012-11-10 DIAGNOSIS — I509 Heart failure, unspecified: Secondary | ICD-10-CM | POA: Diagnosis not present

## 2012-11-10 DIAGNOSIS — Z436 Encounter for attention to other artificial openings of urinary tract: Secondary | ICD-10-CM | POA: Diagnosis not present

## 2012-11-13 DIAGNOSIS — B3749 Other urogenital candidiasis: Secondary | ICD-10-CM | POA: Diagnosis not present

## 2012-11-13 DIAGNOSIS — Z01818 Encounter for other preprocedural examination: Secondary | ICD-10-CM | POA: Diagnosis not present

## 2012-11-13 DIAGNOSIS — B379 Candidiasis, unspecified: Secondary | ICD-10-CM | POA: Diagnosis not present

## 2012-11-13 DIAGNOSIS — N390 Urinary tract infection, site not specified: Secondary | ICD-10-CM | POA: Diagnosis not present

## 2012-11-14 DIAGNOSIS — I129 Hypertensive chronic kidney disease with stage 1 through stage 4 chronic kidney disease, or unspecified chronic kidney disease: Secondary | ICD-10-CM | POA: Diagnosis not present

## 2012-11-14 DIAGNOSIS — Z436 Encounter for attention to other artificial openings of urinary tract: Secondary | ICD-10-CM | POA: Diagnosis not present

## 2012-11-14 DIAGNOSIS — D509 Iron deficiency anemia, unspecified: Secondary | ICD-10-CM | POA: Diagnosis not present

## 2012-11-14 DIAGNOSIS — M159 Polyosteoarthritis, unspecified: Secondary | ICD-10-CM | POA: Diagnosis not present

## 2012-11-14 DIAGNOSIS — N184 Chronic kidney disease, stage 4 (severe): Secondary | ICD-10-CM | POA: Diagnosis not present

## 2012-11-14 DIAGNOSIS — N2581 Secondary hyperparathyroidism of renal origin: Secondary | ICD-10-CM | POA: Diagnosis not present

## 2012-11-14 DIAGNOSIS — N189 Chronic kidney disease, unspecified: Secondary | ICD-10-CM | POA: Diagnosis not present

## 2012-11-14 DIAGNOSIS — I509 Heart failure, unspecified: Secondary | ICD-10-CM | POA: Diagnosis not present

## 2012-11-14 DIAGNOSIS — N135 Crossing vessel and stricture of ureter without hydronephrosis: Secondary | ICD-10-CM | POA: Diagnosis not present

## 2012-11-14 DIAGNOSIS — D649 Anemia, unspecified: Secondary | ICD-10-CM | POA: Diagnosis not present

## 2012-11-16 DIAGNOSIS — Z436 Encounter for attention to other artificial openings of urinary tract: Secondary | ICD-10-CM | POA: Diagnosis not present

## 2012-11-16 DIAGNOSIS — N390 Urinary tract infection, site not specified: Secondary | ICD-10-CM | POA: Diagnosis not present

## 2012-11-16 DIAGNOSIS — N135 Crossing vessel and stricture of ureter without hydronephrosis: Secondary | ICD-10-CM | POA: Diagnosis not present

## 2012-11-17 IMAGING — CR DG CHEST 2V
2 series · 2 of 2 positions shown · non-contrast
Comparison: None.

CLINICAL DATA: Follow up of pneumonia.  Question interstitial lung
disease.  Nonsmoker.  Recurrent pneumonia.

CHEST - 2 VIEW

[view not recorded (1 of 2)]
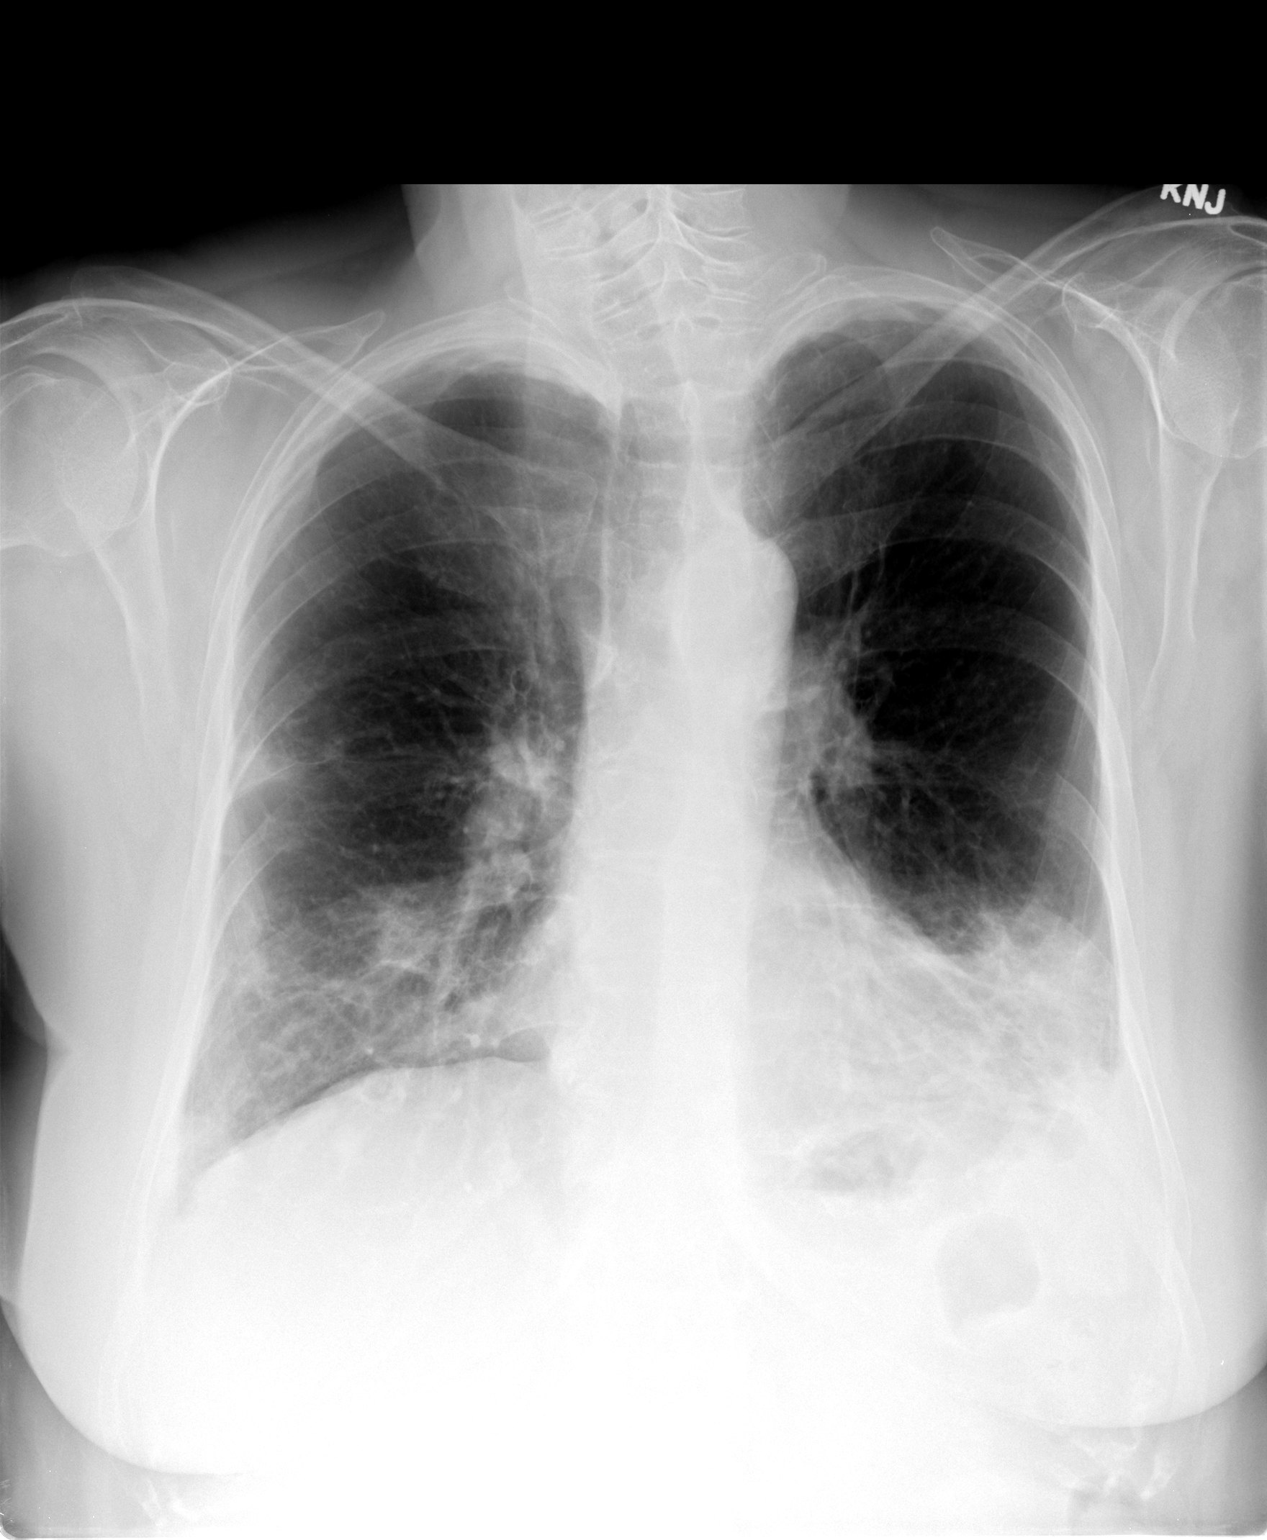

[view not recorded (2 of 2)]
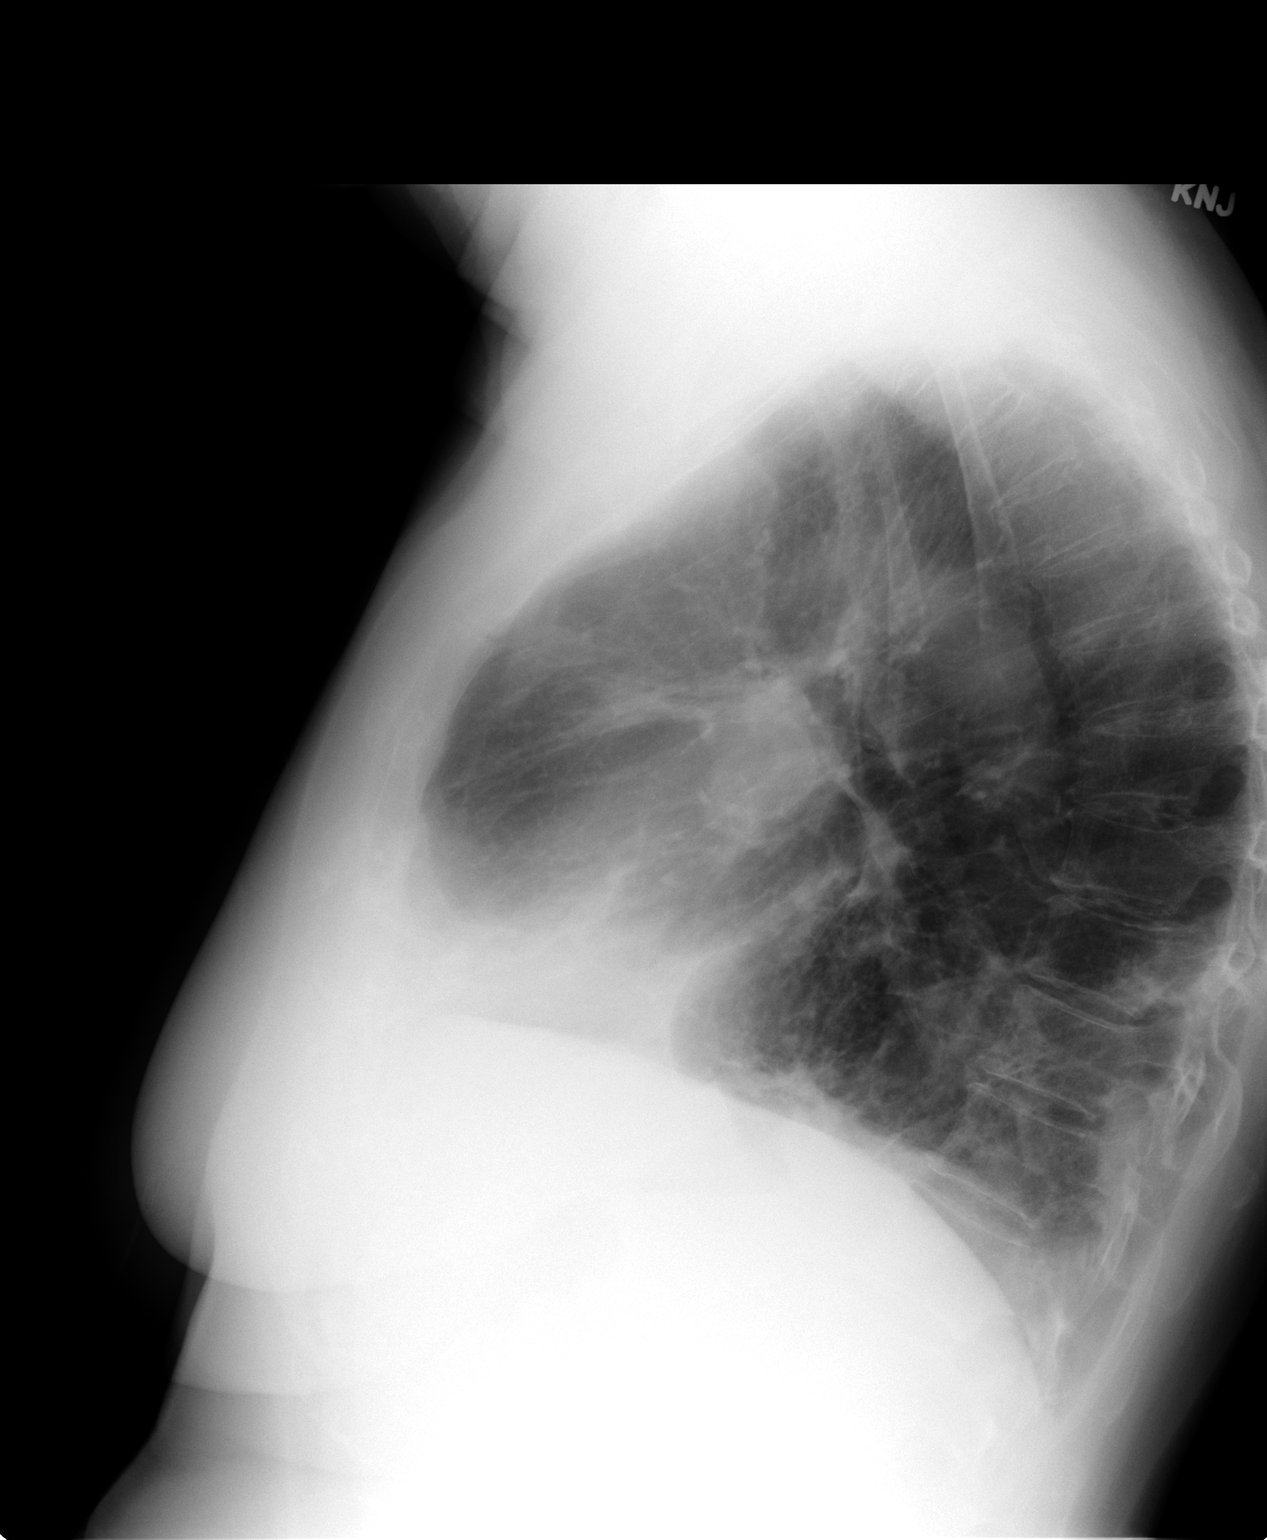

[2 of 2 positions shown; findings below may reference images not displayed]

FINDINGS: Underlying hyperinflation.  Suspect  anterior pulmonary
opacity on the lateral view.  Mild accentuation of expected
thoracic kyphosis.  Osteopenia.

Patient rotated to the right minimally. Midline trachea.  Mild
cardiomegaly.  Otherwise normal mediastinal contours for age.
Small left pleural effusion vs thickening.  Biapical pleural
thickening. No pneumothorax.  Patchy airspace opacities.  Inferior
right upper lobe (likely corresponding to the anterior opacity on
the lateral).  Left greater than right lower lobes.
IMPRESSION: 1.  Multifocal left greater than right airspace disease with trace
left pleural fluid or thickening. In the acute or subacute setting,
most likely infection.  Given the clinical history of recurrent
pneumonia and possible interstitial lung disease, alternate
explanations, including organizing pneumonia or chronic aspiration
would be considerations. Recommend radiographic follow-up until
clearing.
3.  Probable airspace disease in the anterior right upper lobe on
the lateral view.  This should be reevaluated on follow-up to
exclude less likely anterior mediastinal processes.
3.  Mild cardiomegaly and underlying hyperinflation.

## 2012-11-20 DIAGNOSIS — I509 Heart failure, unspecified: Secondary | ICD-10-CM | POA: Diagnosis not present

## 2012-11-20 DIAGNOSIS — N135 Crossing vessel and stricture of ureter without hydronephrosis: Secondary | ICD-10-CM | POA: Diagnosis not present

## 2012-11-20 DIAGNOSIS — M159 Polyosteoarthritis, unspecified: Secondary | ICD-10-CM | POA: Diagnosis not present

## 2012-11-20 DIAGNOSIS — D509 Iron deficiency anemia, unspecified: Secondary | ICD-10-CM | POA: Diagnosis not present

## 2012-11-20 DIAGNOSIS — N189 Chronic kidney disease, unspecified: Secondary | ICD-10-CM | POA: Diagnosis not present

## 2012-11-20 DIAGNOSIS — Z436 Encounter for attention to other artificial openings of urinary tract: Secondary | ICD-10-CM | POA: Diagnosis not present

## 2012-11-22 DIAGNOSIS — D509 Iron deficiency anemia, unspecified: Secondary | ICD-10-CM | POA: Diagnosis not present

## 2012-11-22 DIAGNOSIS — Z436 Encounter for attention to other artificial openings of urinary tract: Secondary | ICD-10-CM | POA: Diagnosis not present

## 2012-11-22 DIAGNOSIS — I509 Heart failure, unspecified: Secondary | ICD-10-CM | POA: Diagnosis not present

## 2012-11-22 DIAGNOSIS — N189 Chronic kidney disease, unspecified: Secondary | ICD-10-CM | POA: Diagnosis not present

## 2012-11-22 DIAGNOSIS — M159 Polyosteoarthritis, unspecified: Secondary | ICD-10-CM | POA: Diagnosis not present

## 2012-11-22 DIAGNOSIS — N135 Crossing vessel and stricture of ureter without hydronephrosis: Secondary | ICD-10-CM | POA: Diagnosis not present

## 2012-11-24 DIAGNOSIS — Z436 Encounter for attention to other artificial openings of urinary tract: Secondary | ICD-10-CM | POA: Diagnosis not present

## 2012-11-24 DIAGNOSIS — D509 Iron deficiency anemia, unspecified: Secondary | ICD-10-CM | POA: Diagnosis not present

## 2012-11-24 DIAGNOSIS — N135 Crossing vessel and stricture of ureter without hydronephrosis: Secondary | ICD-10-CM | POA: Diagnosis not present

## 2012-11-24 DIAGNOSIS — I509 Heart failure, unspecified: Secondary | ICD-10-CM | POA: Diagnosis not present

## 2012-11-24 DIAGNOSIS — M159 Polyosteoarthritis, unspecified: Secondary | ICD-10-CM | POA: Diagnosis not present

## 2012-11-24 DIAGNOSIS — N189 Chronic kidney disease, unspecified: Secondary | ICD-10-CM | POA: Diagnosis not present

## 2012-11-28 DIAGNOSIS — I509 Heart failure, unspecified: Secondary | ICD-10-CM | POA: Diagnosis not present

## 2012-11-28 DIAGNOSIS — M159 Polyosteoarthritis, unspecified: Secondary | ICD-10-CM | POA: Diagnosis not present

## 2012-11-28 DIAGNOSIS — N135 Crossing vessel and stricture of ureter without hydronephrosis: Secondary | ICD-10-CM | POA: Diagnosis not present

## 2012-11-28 DIAGNOSIS — N189 Chronic kidney disease, unspecified: Secondary | ICD-10-CM | POA: Diagnosis not present

## 2012-11-28 DIAGNOSIS — Z436 Encounter for attention to other artificial openings of urinary tract: Secondary | ICD-10-CM | POA: Diagnosis not present

## 2012-11-28 DIAGNOSIS — D509 Iron deficiency anemia, unspecified: Secondary | ICD-10-CM | POA: Diagnosis not present

## 2012-11-30 DIAGNOSIS — Z436 Encounter for attention to other artificial openings of urinary tract: Secondary | ICD-10-CM | POA: Diagnosis not present

## 2012-11-30 DIAGNOSIS — N135 Crossing vessel and stricture of ureter without hydronephrosis: Secondary | ICD-10-CM | POA: Diagnosis not present

## 2012-11-30 DIAGNOSIS — Z6826 Body mass index (BMI) 26.0-26.9, adult: Secondary | ICD-10-CM | POA: Diagnosis not present

## 2012-12-04 DIAGNOSIS — Z436 Encounter for attention to other artificial openings of urinary tract: Secondary | ICD-10-CM | POA: Diagnosis not present

## 2012-12-06 DIAGNOSIS — Z1231 Encounter for screening mammogram for malignant neoplasm of breast: Secondary | ICD-10-CM | POA: Diagnosis not present

## 2012-12-07 DIAGNOSIS — Z436 Encounter for attention to other artificial openings of urinary tract: Secondary | ICD-10-CM | POA: Diagnosis not present

## 2012-12-12 DIAGNOSIS — Z436 Encounter for attention to other artificial openings of urinary tract: Secondary | ICD-10-CM | POA: Diagnosis not present

## 2012-12-14 DIAGNOSIS — Z436 Encounter for attention to other artificial openings of urinary tract: Secondary | ICD-10-CM | POA: Diagnosis not present

## 2012-12-18 DIAGNOSIS — Z436 Encounter for attention to other artificial openings of urinary tract: Secondary | ICD-10-CM | POA: Diagnosis not present

## 2012-12-20 IMAGING — CR DG CHEST 2V
2 series · 2 of 2 positions shown · non-contrast
Comparison: Chest x-ray of 02/25/2011

CLINICAL DATA: Shortness of breath for 1.5-month

CHEST - 2 VIEW

[view not recorded (1 of 2)]
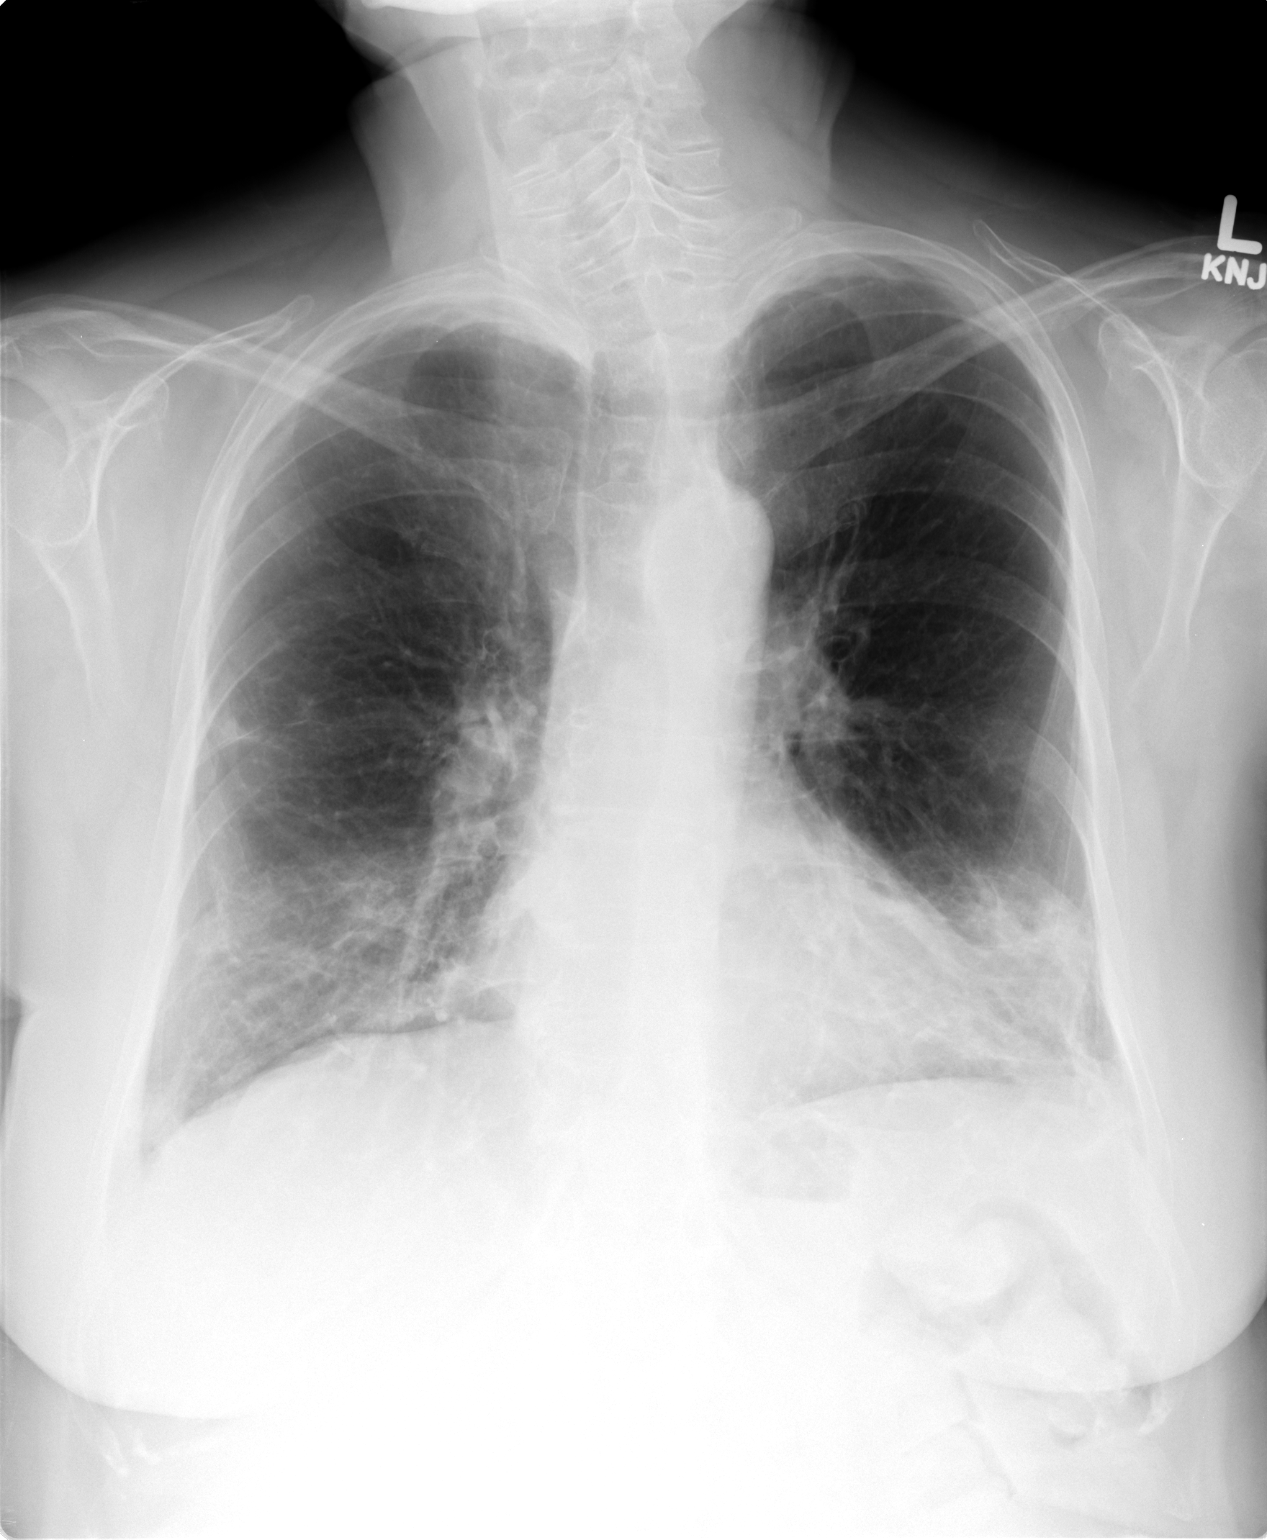

[view not recorded (2 of 2)]
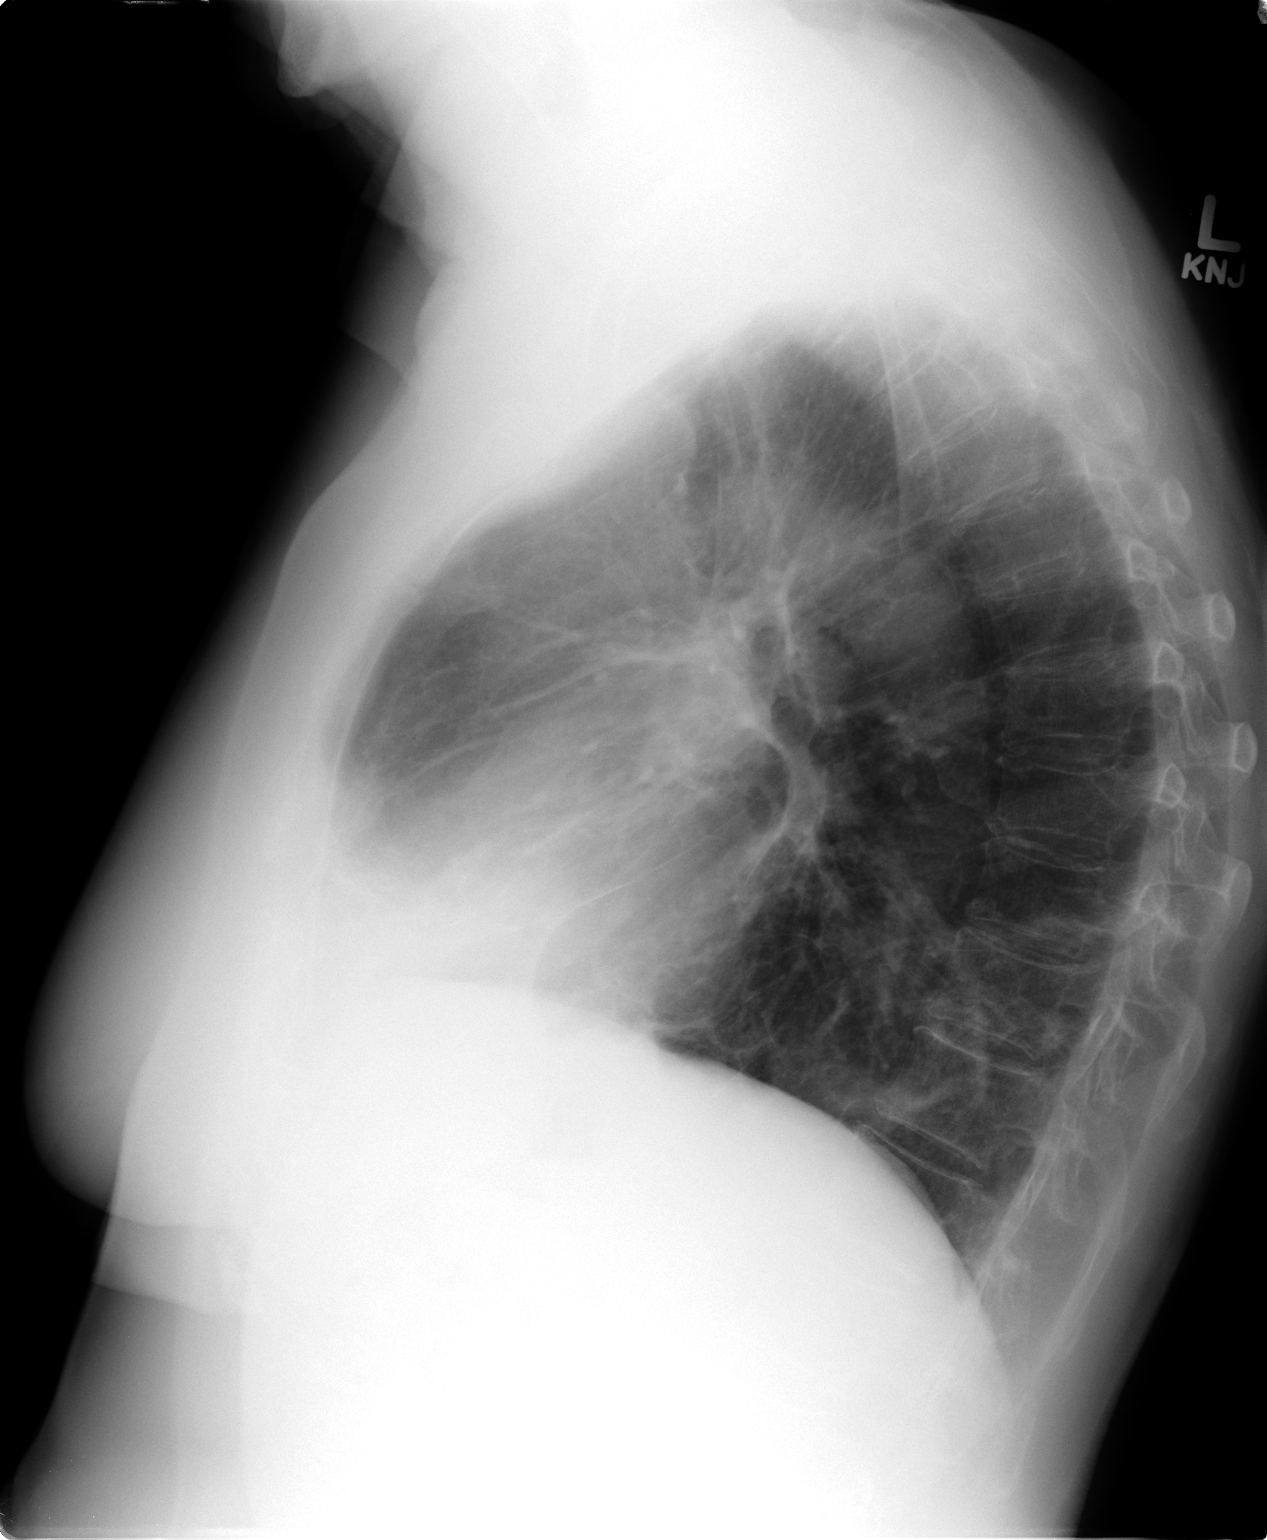

[2 of 2 positions shown; findings below may reference images not displayed]

FINDINGS: The previously noted patchy airspace disease bilaterally
has improved somewhat although smaller opacities remain.  The
primary involvement appears to be within the left lower lobe and or
lingula.  No effusion is seen.  Mediastinal contours are stable.
The heart is within normal limits in size.  No bony abnormality is
seen.
IMPRESSION: Some improvement in foci of patchy airspace disease noted
previously.

## 2012-12-21 DIAGNOSIS — N390 Urinary tract infection, site not specified: Secondary | ICD-10-CM | POA: Diagnosis not present

## 2012-12-22 DIAGNOSIS — Z436 Encounter for attention to other artificial openings of urinary tract: Secondary | ICD-10-CM | POA: Diagnosis not present

## 2012-12-25 DIAGNOSIS — Z436 Encounter for attention to other artificial openings of urinary tract: Secondary | ICD-10-CM | POA: Diagnosis not present

## 2012-12-27 DIAGNOSIS — N135 Crossing vessel and stricture of ureter without hydronephrosis: Secondary | ICD-10-CM | POA: Diagnosis not present

## 2012-12-28 DIAGNOSIS — Z436 Encounter for attention to other artificial openings of urinary tract: Secondary | ICD-10-CM | POA: Diagnosis not present

## 2012-12-28 DIAGNOSIS — N135 Crossing vessel and stricture of ureter without hydronephrosis: Secondary | ICD-10-CM | POA: Diagnosis not present

## 2013-01-02 DIAGNOSIS — Z436 Encounter for attention to other artificial openings of urinary tract: Secondary | ICD-10-CM | POA: Diagnosis not present

## 2013-01-04 DIAGNOSIS — Z436 Encounter for attention to other artificial openings of urinary tract: Secondary | ICD-10-CM | POA: Diagnosis not present

## 2013-01-08 DIAGNOSIS — Z436 Encounter for attention to other artificial openings of urinary tract: Secondary | ICD-10-CM | POA: Diagnosis not present

## 2013-01-09 DIAGNOSIS — H3530 Unspecified macular degeneration: Secondary | ICD-10-CM | POA: Diagnosis not present

## 2013-01-16 DIAGNOSIS — J019 Acute sinusitis, unspecified: Secondary | ICD-10-CM | POA: Diagnosis not present

## 2013-01-16 DIAGNOSIS — R509 Fever, unspecified: Secondary | ICD-10-CM | POA: Diagnosis not present

## 2013-01-22 DIAGNOSIS — Z436 Encounter for attention to other artificial openings of urinary tract: Secondary | ICD-10-CM | POA: Diagnosis not present

## 2013-01-29 DIAGNOSIS — Z436 Encounter for attention to other artificial openings of urinary tract: Secondary | ICD-10-CM | POA: Diagnosis not present

## 2013-02-05 DIAGNOSIS — Z436 Encounter for attention to other artificial openings of urinary tract: Secondary | ICD-10-CM | POA: Diagnosis not present

## 2013-02-08 DIAGNOSIS — Z436 Encounter for attention to other artificial openings of urinary tract: Secondary | ICD-10-CM | POA: Diagnosis not present

## 2013-02-08 DIAGNOSIS — N1330 Unspecified hydronephrosis: Secondary | ICD-10-CM | POA: Diagnosis not present

## 2013-02-08 IMAGING — CR DG CHEST 2V
2 series · 2 of 2 positions shown · non-contrast
Comparison: 03/30/2011

CLINICAL DATA: Shortness of breath, follow up pneumonia

CHEST - 2 VIEW

[view not recorded (1 of 2)]
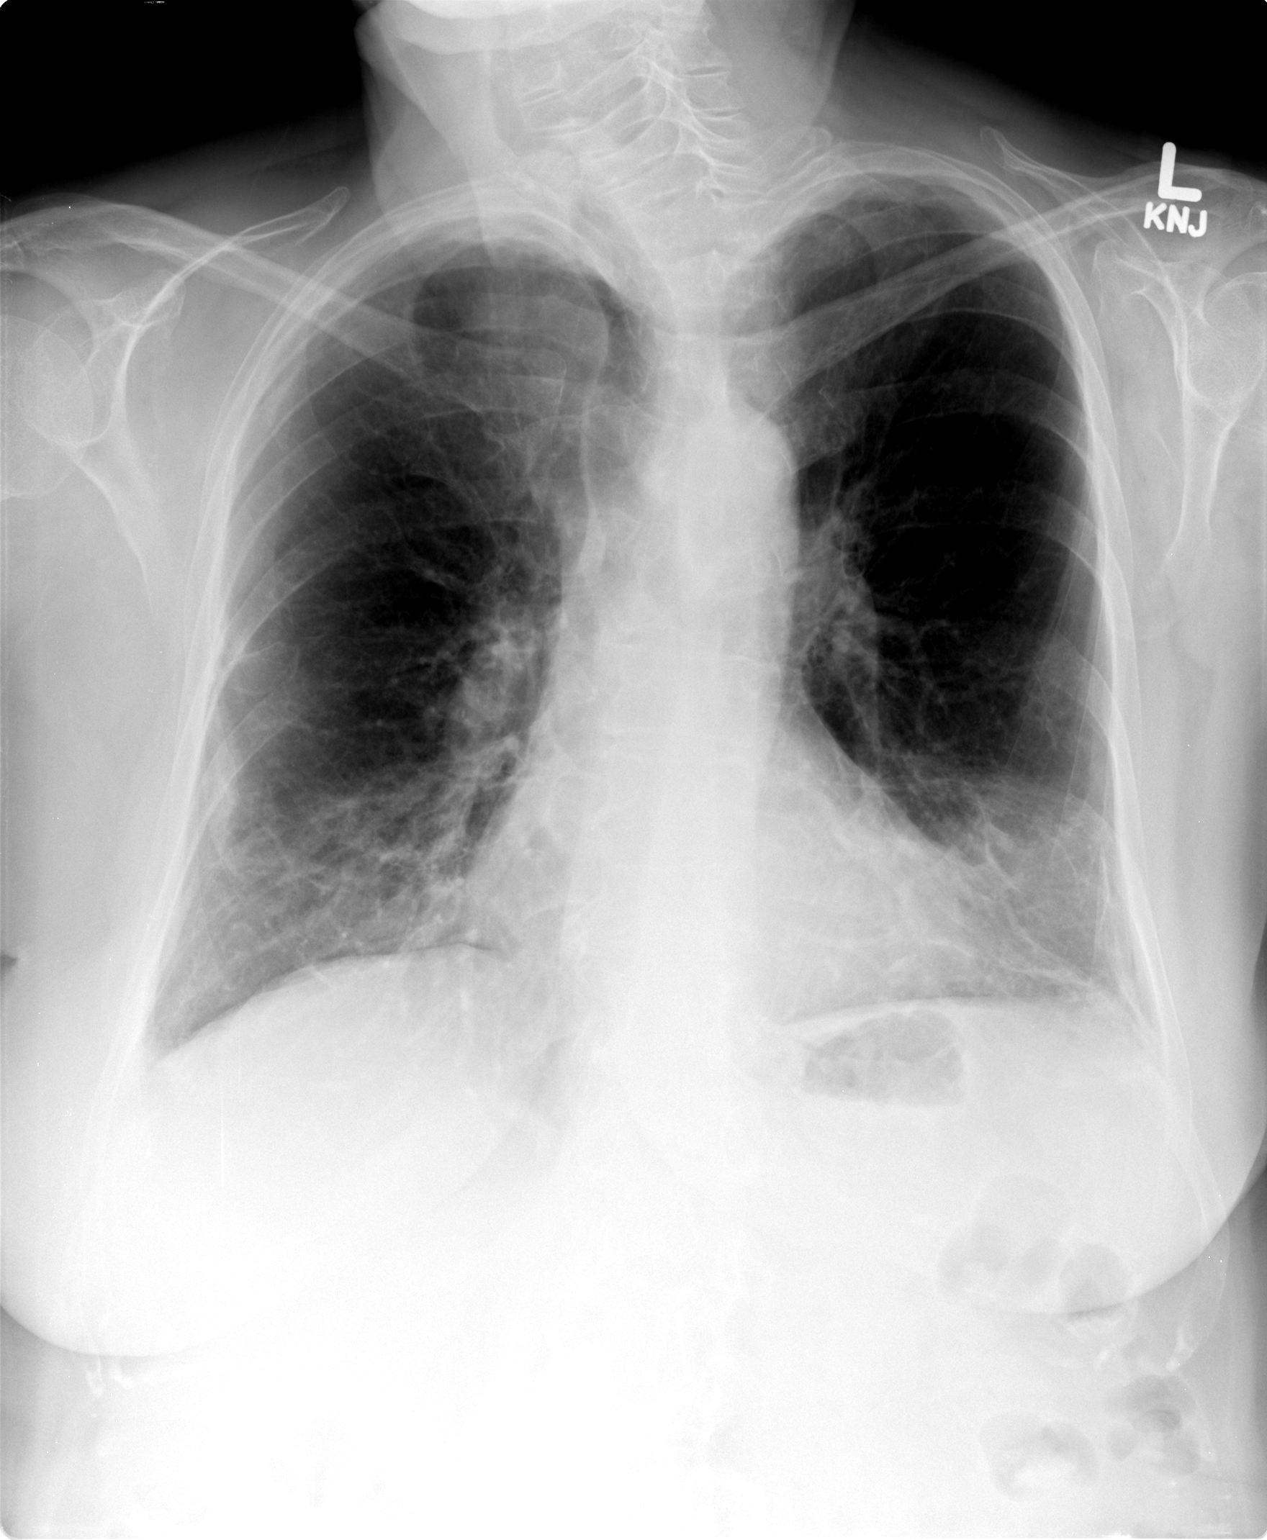

[view not recorded (2 of 2)]
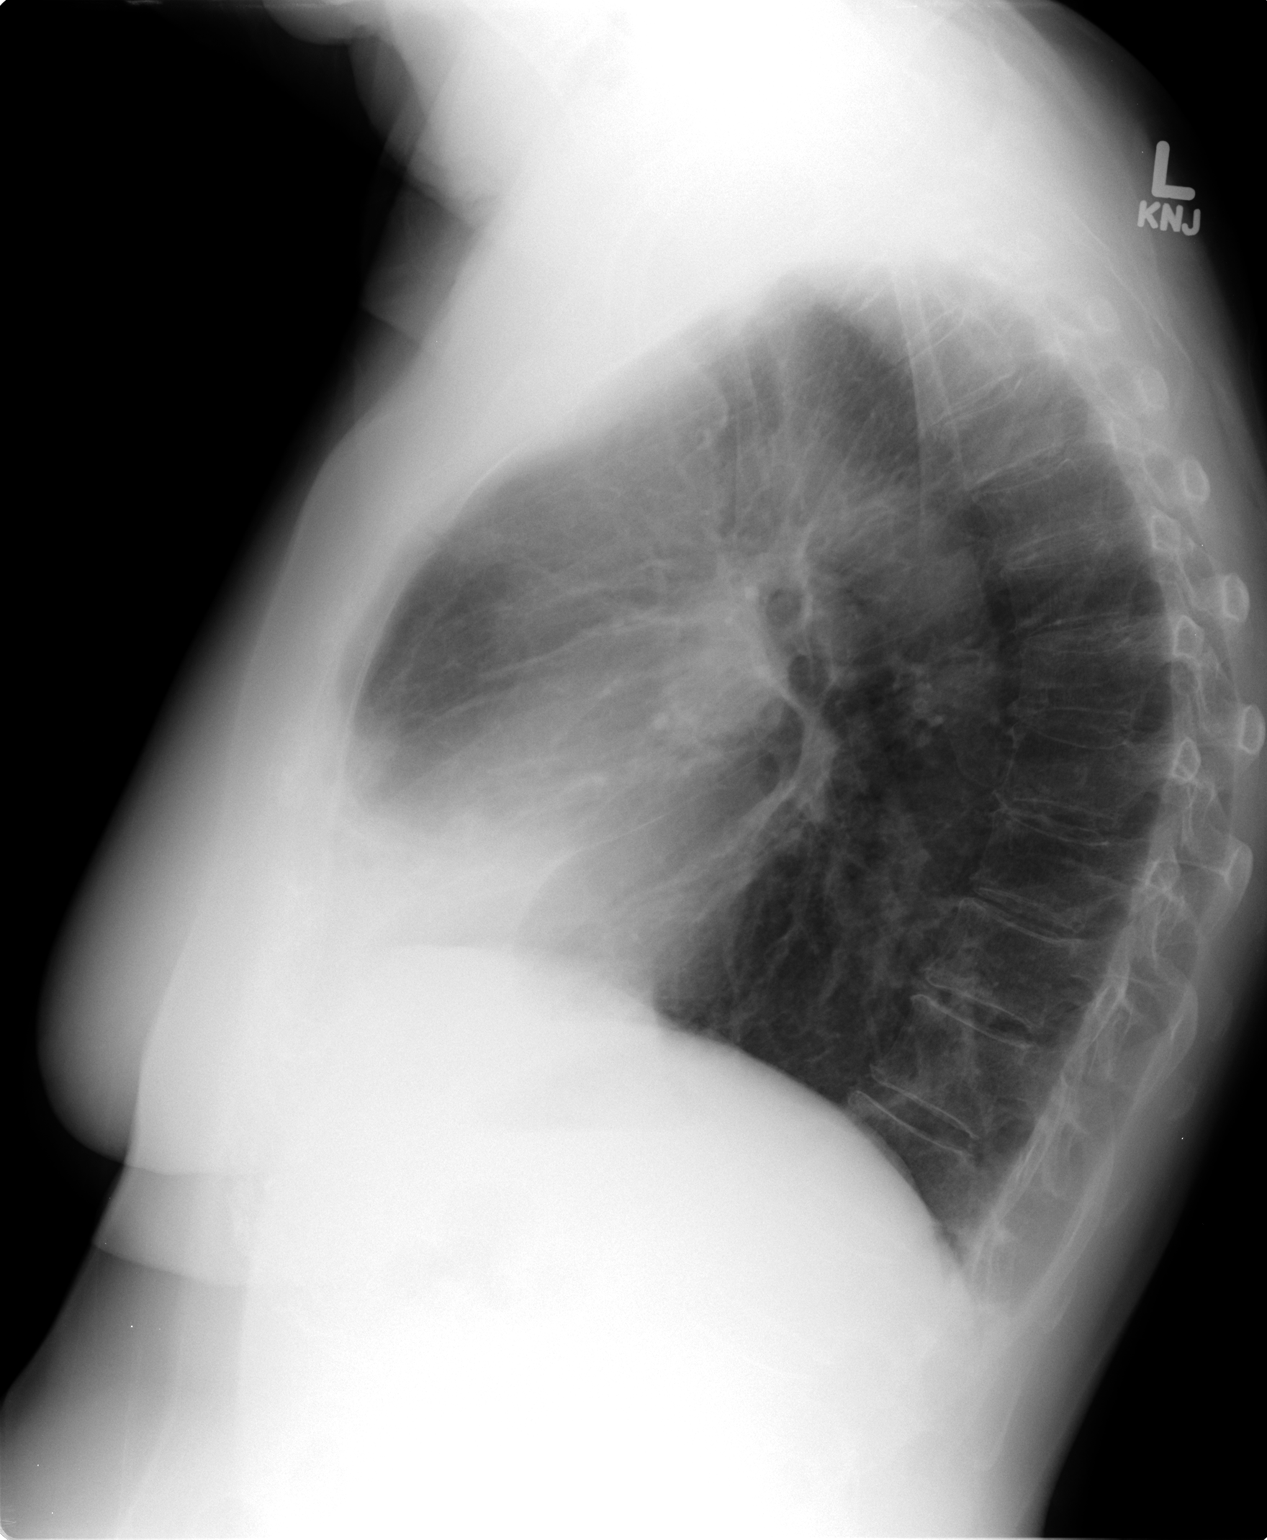

[2 of 2 positions shown; findings below may reference images not displayed]

FINDINGS: Upper-normal size of cardiac silhouette.
Mediastinal contours and pulmonary vascularity normal.
Atherosclerotic calcification aorta.
Emphysematous and bronchitic changes.
Bibasilar atelectasis/scarring.
Previously identified patchy airspace infiltrates at the lung
bases, greater on the left, appear improved.
Remaining lungs clear.
No pleural effusion or pneumothorax.
Bones demineralized.
IMPRESSION: Emphysematous and chronic bronchitic changes with bibasilar
atelectasis versus scarring.
Improved basilar airspace infiltrates.

## 2013-02-16 DIAGNOSIS — Z436 Encounter for attention to other artificial openings of urinary tract: Secondary | ICD-10-CM | POA: Diagnosis not present

## 2013-02-22 DIAGNOSIS — Z436 Encounter for attention to other artificial openings of urinary tract: Secondary | ICD-10-CM | POA: Diagnosis not present

## 2013-02-22 DIAGNOSIS — E039 Hypothyroidism, unspecified: Secondary | ICD-10-CM | POA: Diagnosis not present

## 2013-02-22 DIAGNOSIS — E7800 Pure hypercholesterolemia, unspecified: Secondary | ICD-10-CM | POA: Diagnosis not present

## 2013-02-22 DIAGNOSIS — R7309 Other abnormal glucose: Secondary | ICD-10-CM | POA: Diagnosis not present

## 2013-02-28 DIAGNOSIS — N184 Chronic kidney disease, stage 4 (severe): Secondary | ICD-10-CM | POA: Diagnosis not present

## 2013-02-28 DIAGNOSIS — D509 Iron deficiency anemia, unspecified: Secondary | ICD-10-CM | POA: Diagnosis not present

## 2013-02-28 DIAGNOSIS — E7800 Pure hypercholesterolemia, unspecified: Secondary | ICD-10-CM | POA: Diagnosis not present

## 2013-02-28 DIAGNOSIS — E039 Hypothyroidism, unspecified: Secondary | ICD-10-CM | POA: Diagnosis not present

## 2013-03-07 DIAGNOSIS — Z436 Encounter for attention to other artificial openings of urinary tract: Secondary | ICD-10-CM | POA: Diagnosis not present

## 2013-03-07 DIAGNOSIS — R82998 Other abnormal findings in urine: Secondary | ICD-10-CM | POA: Diagnosis not present

## 2013-03-13 DIAGNOSIS — M549 Dorsalgia, unspecified: Secondary | ICD-10-CM | POA: Diagnosis not present

## 2013-03-16 DIAGNOSIS — D649 Anemia, unspecified: Secondary | ICD-10-CM | POA: Diagnosis not present

## 2013-03-16 DIAGNOSIS — I129 Hypertensive chronic kidney disease with stage 1 through stage 4 chronic kidney disease, or unspecified chronic kidney disease: Secondary | ICD-10-CM | POA: Diagnosis not present

## 2013-03-16 DIAGNOSIS — E875 Hyperkalemia: Secondary | ICD-10-CM | POA: Diagnosis not present

## 2013-03-16 DIAGNOSIS — N12 Tubulo-interstitial nephritis, not specified as acute or chronic: Secondary | ICD-10-CM | POA: Diagnosis not present

## 2013-03-16 DIAGNOSIS — N184 Chronic kidney disease, stage 4 (severe): Secondary | ICD-10-CM | POA: Diagnosis not present

## 2013-03-19 DIAGNOSIS — N1330 Unspecified hydronephrosis: Secondary | ICD-10-CM | POA: Diagnosis not present

## 2013-03-19 DIAGNOSIS — Z436 Encounter for attention to other artificial openings of urinary tract: Secondary | ICD-10-CM | POA: Diagnosis not present

## 2013-03-19 DIAGNOSIS — N135 Crossing vessel and stricture of ureter without hydronephrosis: Secondary | ICD-10-CM | POA: Diagnosis not present

## 2013-03-26 DIAGNOSIS — Z436 Encounter for attention to other artificial openings of urinary tract: Secondary | ICD-10-CM | POA: Diagnosis not present

## 2013-04-02 DIAGNOSIS — Z436 Encounter for attention to other artificial openings of urinary tract: Secondary | ICD-10-CM | POA: Diagnosis not present

## 2013-04-09 DIAGNOSIS — Z436 Encounter for attention to other artificial openings of urinary tract: Secondary | ICD-10-CM | POA: Diagnosis not present

## 2013-04-16 DIAGNOSIS — Z436 Encounter for attention to other artificial openings of urinary tract: Secondary | ICD-10-CM | POA: Diagnosis not present

## 2013-04-23 DIAGNOSIS — Z436 Encounter for attention to other artificial openings of urinary tract: Secondary | ICD-10-CM | POA: Diagnosis not present

## 2013-04-27 DIAGNOSIS — I509 Heart failure, unspecified: Secondary | ICD-10-CM | POA: Diagnosis not present

## 2013-04-27 DIAGNOSIS — H35369 Drusen (degenerative) of macula, unspecified eye: Secondary | ICD-10-CM | POA: Diagnosis not present

## 2013-04-27 DIAGNOSIS — H353190 Nonexudative age-related macular degeneration, unspecified eye, stage unspecified: Secondary | ICD-10-CM | POA: Diagnosis not present

## 2013-04-30 DIAGNOSIS — Z436 Encounter for attention to other artificial openings of urinary tract: Secondary | ICD-10-CM | POA: Diagnosis not present

## 2013-05-03 DIAGNOSIS — I951 Orthostatic hypotension: Secondary | ICD-10-CM | POA: Diagnosis not present

## 2013-05-03 DIAGNOSIS — N1330 Unspecified hydronephrosis: Secondary | ICD-10-CM | POA: Diagnosis not present

## 2013-05-03 DIAGNOSIS — Z436 Encounter for attention to other artificial openings of urinary tract: Secondary | ICD-10-CM | POA: Diagnosis not present

## 2013-05-03 DIAGNOSIS — Z954 Presence of other heart-valve replacement: Secondary | ICD-10-CM | POA: Diagnosis not present

## 2013-05-04 DIAGNOSIS — E7800 Pure hypercholesterolemia, unspecified: Secondary | ICD-10-CM | POA: Diagnosis not present

## 2013-05-04 DIAGNOSIS — E538 Deficiency of other specified B group vitamins: Secondary | ICD-10-CM | POA: Diagnosis not present

## 2013-05-04 DIAGNOSIS — D509 Iron deficiency anemia, unspecified: Secondary | ICD-10-CM | POA: Diagnosis not present

## 2013-05-10 DIAGNOSIS — Z436 Encounter for attention to other artificial openings of urinary tract: Secondary | ICD-10-CM | POA: Diagnosis not present

## 2013-05-17 DIAGNOSIS — Z436 Encounter for attention to other artificial openings of urinary tract: Secondary | ICD-10-CM | POA: Diagnosis not present

## 2013-05-24 DIAGNOSIS — Z436 Encounter for attention to other artificial openings of urinary tract: Secondary | ICD-10-CM | POA: Diagnosis not present

## 2013-05-31 DIAGNOSIS — Z436 Encounter for attention to other artificial openings of urinary tract: Secondary | ICD-10-CM | POA: Diagnosis not present

## 2013-06-08 DIAGNOSIS — R109 Unspecified abdominal pain: Secondary | ICD-10-CM | POA: Diagnosis not present

## 2013-06-08 DIAGNOSIS — E0789 Other specified disorders of thyroid: Secondary | ICD-10-CM | POA: Diagnosis not present

## 2013-06-08 DIAGNOSIS — N390 Urinary tract infection, site not specified: Secondary | ICD-10-CM | POA: Diagnosis not present

## 2013-06-08 DIAGNOSIS — Z436 Encounter for attention to other artificial openings of urinary tract: Secondary | ICD-10-CM | POA: Diagnosis not present

## 2013-06-08 DIAGNOSIS — T8389XA Other specified complication of genitourinary prosthetic devices, implants and grafts, initial encounter: Secondary | ICD-10-CM | POA: Diagnosis not present

## 2013-06-08 DIAGNOSIS — Z885 Allergy status to narcotic agent status: Secondary | ICD-10-CM | POA: Diagnosis not present

## 2013-06-14 DIAGNOSIS — N1330 Unspecified hydronephrosis: Secondary | ICD-10-CM | POA: Diagnosis not present

## 2013-06-14 DIAGNOSIS — N135 Crossing vessel and stricture of ureter without hydronephrosis: Secondary | ICD-10-CM | POA: Diagnosis not present

## 2013-06-14 DIAGNOSIS — Z436 Encounter for attention to other artificial openings of urinary tract: Secondary | ICD-10-CM | POA: Diagnosis not present

## 2013-06-20 DIAGNOSIS — N12 Tubulo-interstitial nephritis, not specified as acute or chronic: Secondary | ICD-10-CM | POA: Diagnosis not present

## 2013-06-20 DIAGNOSIS — N135 Crossing vessel and stricture of ureter without hydronephrosis: Secondary | ICD-10-CM | POA: Diagnosis not present

## 2013-06-21 DIAGNOSIS — Z436 Encounter for attention to other artificial openings of urinary tract: Secondary | ICD-10-CM | POA: Diagnosis not present

## 2013-06-28 DIAGNOSIS — Z436 Encounter for attention to other artificial openings of urinary tract: Secondary | ICD-10-CM | POA: Diagnosis not present

## 2013-07-03 DIAGNOSIS — E538 Deficiency of other specified B group vitamins: Secondary | ICD-10-CM | POA: Diagnosis not present

## 2013-07-03 DIAGNOSIS — D509 Iron deficiency anemia, unspecified: Secondary | ICD-10-CM | POA: Diagnosis not present

## 2013-07-05 DIAGNOSIS — Z436 Encounter for attention to other artificial openings of urinary tract: Secondary | ICD-10-CM | POA: Diagnosis not present

## 2013-07-07 DIAGNOSIS — N390 Urinary tract infection, site not specified: Secondary | ICD-10-CM | POA: Diagnosis not present

## 2013-07-07 DIAGNOSIS — R109 Unspecified abdominal pain: Secondary | ICD-10-CM | POA: Diagnosis not present

## 2013-07-07 DIAGNOSIS — N183 Chronic kidney disease, stage 3 unspecified: Secondary | ICD-10-CM | POA: Diagnosis not present

## 2013-07-07 DIAGNOSIS — R110 Nausea: Secondary | ICD-10-CM | POA: Diagnosis not present

## 2013-07-07 DIAGNOSIS — R319 Hematuria, unspecified: Secondary | ICD-10-CM | POA: Diagnosis not present

## 2013-07-07 DIAGNOSIS — E785 Hyperlipidemia, unspecified: Secondary | ICD-10-CM | POA: Diagnosis not present

## 2013-07-07 DIAGNOSIS — Z936 Other artificial openings of urinary tract status: Secondary | ICD-10-CM | POA: Diagnosis not present

## 2013-07-12 DIAGNOSIS — Z436 Encounter for attention to other artificial openings of urinary tract: Secondary | ICD-10-CM | POA: Diagnosis not present

## 2013-07-18 DIAGNOSIS — I25810 Atherosclerosis of coronary artery bypass graft(s) without angina pectoris: Secondary | ICD-10-CM | POA: Diagnosis not present

## 2013-07-18 DIAGNOSIS — N390 Urinary tract infection, site not specified: Secondary | ICD-10-CM | POA: Diagnosis not present

## 2013-07-18 DIAGNOSIS — R0989 Other specified symptoms and signs involving the circulatory and respiratory systems: Secondary | ICD-10-CM | POA: Diagnosis not present

## 2013-07-18 DIAGNOSIS — Z954 Presence of other heart-valve replacement: Secondary | ICD-10-CM | POA: Diagnosis not present

## 2013-07-18 DIAGNOSIS — R0609 Other forms of dyspnea: Secondary | ICD-10-CM | POA: Diagnosis not present

## 2013-07-18 DIAGNOSIS — I951 Orthostatic hypotension: Secondary | ICD-10-CM | POA: Diagnosis not present

## 2013-07-19 DIAGNOSIS — Z436 Encounter for attention to other artificial openings of urinary tract: Secondary | ICD-10-CM | POA: Diagnosis not present

## 2013-07-26 DIAGNOSIS — Z436 Encounter for attention to other artificial openings of urinary tract: Secondary | ICD-10-CM | POA: Diagnosis not present

## 2013-07-26 DIAGNOSIS — N390 Urinary tract infection, site not specified: Secondary | ICD-10-CM | POA: Diagnosis not present

## 2013-08-01 DIAGNOSIS — I129 Hypertensive chronic kidney disease with stage 1 through stage 4 chronic kidney disease, or unspecified chronic kidney disease: Secondary | ICD-10-CM | POA: Diagnosis not present

## 2013-08-01 DIAGNOSIS — D649 Anemia, unspecified: Secondary | ICD-10-CM | POA: Diagnosis not present

## 2013-08-01 DIAGNOSIS — E875 Hyperkalemia: Secondary | ICD-10-CM | POA: Diagnosis not present

## 2013-08-01 DIAGNOSIS — Z01810 Encounter for preprocedural cardiovascular examination: Secondary | ICD-10-CM | POA: Diagnosis not present

## 2013-08-01 DIAGNOSIS — Z8679 Personal history of other diseases of the circulatory system: Secondary | ICD-10-CM | POA: Diagnosis not present

## 2013-08-01 DIAGNOSIS — N184 Chronic kidney disease, stage 4 (severe): Secondary | ICD-10-CM | POA: Diagnosis not present

## 2013-08-01 DIAGNOSIS — E785 Hyperlipidemia, unspecified: Secondary | ICD-10-CM | POA: Diagnosis not present

## 2013-08-02 DIAGNOSIS — Z436 Encounter for attention to other artificial openings of urinary tract: Secondary | ICD-10-CM | POA: Diagnosis not present

## 2013-08-08 DIAGNOSIS — Z86718 Personal history of other venous thrombosis and embolism: Secondary | ICD-10-CM | POA: Diagnosis not present

## 2013-08-08 DIAGNOSIS — N135 Crossing vessel and stricture of ureter without hydronephrosis: Secondary | ICD-10-CM | POA: Diagnosis not present

## 2013-08-08 DIAGNOSIS — Z91013 Allergy to seafood: Secondary | ICD-10-CM | POA: Diagnosis not present

## 2013-08-08 DIAGNOSIS — K219 Gastro-esophageal reflux disease without esophagitis: Secondary | ICD-10-CM | POA: Diagnosis not present

## 2013-08-08 DIAGNOSIS — R918 Other nonspecific abnormal finding of lung field: Secondary | ICD-10-CM | POA: Diagnosis not present

## 2013-08-08 DIAGNOSIS — Z885 Allergy status to narcotic agent status: Secondary | ICD-10-CM | POA: Diagnosis not present

## 2013-08-08 DIAGNOSIS — N183 Chronic kidney disease, stage 3 unspecified: Secondary | ICD-10-CM | POA: Diagnosis not present

## 2013-08-08 DIAGNOSIS — Z951 Presence of aortocoronary bypass graft: Secondary | ICD-10-CM | POA: Diagnosis not present

## 2013-08-08 DIAGNOSIS — Z881 Allergy status to other antibiotic agents status: Secondary | ICD-10-CM | POA: Diagnosis not present

## 2013-08-08 DIAGNOSIS — Z954 Presence of other heart-valve replacement: Secondary | ICD-10-CM | POA: Diagnosis not present

## 2013-08-08 DIAGNOSIS — E039 Hypothyroidism, unspecified: Secondary | ICD-10-CM | POA: Diagnosis not present

## 2013-08-08 DIAGNOSIS — I509 Heart failure, unspecified: Secondary | ICD-10-CM | POA: Diagnosis not present

## 2013-08-08 DIAGNOSIS — I2510 Atherosclerotic heart disease of native coronary artery without angina pectoris: Secondary | ICD-10-CM | POA: Diagnosis not present

## 2013-08-08 DIAGNOSIS — Z87440 Personal history of urinary (tract) infections: Secondary | ICD-10-CM | POA: Diagnosis not present

## 2013-08-08 DIAGNOSIS — Z7982 Long term (current) use of aspirin: Secondary | ICD-10-CM | POA: Diagnosis not present

## 2013-08-08 DIAGNOSIS — I129 Hypertensive chronic kidney disease with stage 1 through stage 4 chronic kidney disease, or unspecified chronic kidney disease: Secondary | ICD-10-CM | POA: Diagnosis not present

## 2013-08-09 DIAGNOSIS — E039 Hypothyroidism, unspecified: Secondary | ICD-10-CM | POA: Diagnosis not present

## 2013-08-09 DIAGNOSIS — Z87440 Personal history of urinary (tract) infections: Secondary | ICD-10-CM | POA: Diagnosis not present

## 2013-08-09 DIAGNOSIS — N189 Chronic kidney disease, unspecified: Secondary | ICD-10-CM | POA: Diagnosis not present

## 2013-08-09 DIAGNOSIS — N135 Crossing vessel and stricture of ureter without hydronephrosis: Secondary | ICD-10-CM | POA: Diagnosis not present

## 2013-08-09 DIAGNOSIS — I2510 Atherosclerotic heart disease of native coronary artery without angina pectoris: Secondary | ICD-10-CM | POA: Diagnosis not present

## 2013-08-09 DIAGNOSIS — N183 Chronic kidney disease, stage 3 unspecified: Secondary | ICD-10-CM | POA: Diagnosis not present

## 2013-08-09 DIAGNOSIS — N179 Acute kidney failure, unspecified: Secondary | ICD-10-CM | POA: Diagnosis not present

## 2013-08-09 DIAGNOSIS — N201 Calculus of ureter: Secondary | ICD-10-CM | POA: Diagnosis not present

## 2013-08-09 DIAGNOSIS — I129 Hypertensive chronic kidney disease with stage 1 through stage 4 chronic kidney disease, or unspecified chronic kidney disease: Secondary | ICD-10-CM | POA: Diagnosis not present

## 2013-08-10 DIAGNOSIS — N183 Chronic kidney disease, stage 3 unspecified: Secondary | ICD-10-CM | POA: Diagnosis not present

## 2013-08-10 DIAGNOSIS — I2510 Atherosclerotic heart disease of native coronary artery without angina pectoris: Secondary | ICD-10-CM | POA: Diagnosis not present

## 2013-08-10 DIAGNOSIS — Q6239 Other obstructive defects of renal pelvis and ureter: Secondary | ICD-10-CM | POA: Diagnosis not present

## 2013-08-10 DIAGNOSIS — I129 Hypertensive chronic kidney disease with stage 1 through stage 4 chronic kidney disease, or unspecified chronic kidney disease: Secondary | ICD-10-CM | POA: Diagnosis not present

## 2013-08-10 DIAGNOSIS — Z87440 Personal history of urinary (tract) infections: Secondary | ICD-10-CM | POA: Diagnosis not present

## 2013-08-10 DIAGNOSIS — E039 Hypothyroidism, unspecified: Secondary | ICD-10-CM | POA: Diagnosis not present

## 2013-08-10 DIAGNOSIS — N135 Crossing vessel and stricture of ureter without hydronephrosis: Secondary | ICD-10-CM | POA: Diagnosis not present

## 2013-08-11 DIAGNOSIS — N135 Crossing vessel and stricture of ureter without hydronephrosis: Secondary | ICD-10-CM | POA: Diagnosis not present

## 2013-08-11 DIAGNOSIS — E039 Hypothyroidism, unspecified: Secondary | ICD-10-CM | POA: Diagnosis not present

## 2013-08-11 DIAGNOSIS — N183 Chronic kidney disease, stage 3 unspecified: Secondary | ICD-10-CM | POA: Diagnosis not present

## 2013-08-11 DIAGNOSIS — I2510 Atherosclerotic heart disease of native coronary artery without angina pectoris: Secondary | ICD-10-CM | POA: Diagnosis not present

## 2013-08-11 DIAGNOSIS — Z87440 Personal history of urinary (tract) infections: Secondary | ICD-10-CM | POA: Diagnosis not present

## 2013-08-11 DIAGNOSIS — I129 Hypertensive chronic kidney disease with stage 1 through stage 4 chronic kidney disease, or unspecified chronic kidney disease: Secondary | ICD-10-CM | POA: Diagnosis not present

## 2013-08-13 DIAGNOSIS — Z434 Encounter for attention to other artificial openings of digestive tract: Secondary | ICD-10-CM | POA: Diagnosis not present

## 2013-08-29 DIAGNOSIS — R7309 Other abnormal glucose: Secondary | ICD-10-CM | POA: Diagnosis not present

## 2013-08-29 DIAGNOSIS — E538 Deficiency of other specified B group vitamins: Secondary | ICD-10-CM | POA: Diagnosis not present

## 2013-08-29 DIAGNOSIS — E7800 Pure hypercholesterolemia, unspecified: Secondary | ICD-10-CM | POA: Diagnosis not present

## 2013-08-29 DIAGNOSIS — D509 Iron deficiency anemia, unspecified: Secondary | ICD-10-CM | POA: Diagnosis not present

## 2013-08-29 DIAGNOSIS — E039 Hypothyroidism, unspecified: Secondary | ICD-10-CM | POA: Diagnosis not present

## 2013-08-31 DIAGNOSIS — Z9181 History of falling: Secondary | ICD-10-CM | POA: Diagnosis not present

## 2013-08-31 DIAGNOSIS — E782 Mixed hyperlipidemia: Secondary | ICD-10-CM | POA: Diagnosis not present

## 2013-08-31 DIAGNOSIS — Z1331 Encounter for screening for depression: Secondary | ICD-10-CM | POA: Diagnosis not present

## 2013-09-07 DIAGNOSIS — D509 Iron deficiency anemia, unspecified: Secondary | ICD-10-CM | POA: Diagnosis not present

## 2013-09-07 DIAGNOSIS — H5210 Myopia, unspecified eye: Secondary | ICD-10-CM | POA: Diagnosis not present

## 2013-09-07 DIAGNOSIS — Z952 Presence of prosthetic heart valve: Secondary | ICD-10-CM | POA: Diagnosis not present

## 2013-09-07 DIAGNOSIS — H353190 Nonexudative age-related macular degeneration, unspecified eye, stage unspecified: Secondary | ICD-10-CM | POA: Diagnosis not present

## 2013-09-10 DIAGNOSIS — N390 Urinary tract infection, site not specified: Secondary | ICD-10-CM | POA: Diagnosis not present

## 2013-09-10 DIAGNOSIS — N135 Crossing vessel and stricture of ureter without hydronephrosis: Secondary | ICD-10-CM | POA: Diagnosis not present

## 2013-10-08 DIAGNOSIS — Z13820 Encounter for screening for osteoporosis: Secondary | ICD-10-CM | POA: Diagnosis not present

## 2013-10-08 DIAGNOSIS — Z8262 Family history of osteoporosis: Secondary | ICD-10-CM | POA: Diagnosis not present

## 2013-10-10 DIAGNOSIS — N135 Crossing vessel and stricture of ureter without hydronephrosis: Secondary | ICD-10-CM | POA: Diagnosis not present

## 2013-11-08 DIAGNOSIS — I951 Orthostatic hypotension: Secondary | ICD-10-CM | POA: Diagnosis not present

## 2013-11-08 DIAGNOSIS — Z954 Presence of other heart-valve replacement: Secondary | ICD-10-CM | POA: Diagnosis not present

## 2013-11-12 DIAGNOSIS — M810 Age-related osteoporosis without current pathological fracture: Secondary | ICD-10-CM | POA: Diagnosis not present

## 2013-11-12 DIAGNOSIS — E7800 Pure hypercholesterolemia, unspecified: Secondary | ICD-10-CM | POA: Diagnosis not present

## 2013-11-12 DIAGNOSIS — K219 Gastro-esophageal reflux disease without esophagitis: Secondary | ICD-10-CM | POA: Diagnosis not present

## 2013-12-07 DIAGNOSIS — Z1231 Encounter for screening mammogram for malignant neoplasm of breast: Secondary | ICD-10-CM | POA: Diagnosis not present

## 2013-12-19 DIAGNOSIS — I129 Hypertensive chronic kidney disease with stage 1 through stage 4 chronic kidney disease, or unspecified chronic kidney disease: Secondary | ICD-10-CM | POA: Diagnosis not present

## 2013-12-19 DIAGNOSIS — D649 Anemia, unspecified: Secondary | ICD-10-CM | POA: Diagnosis not present

## 2013-12-19 DIAGNOSIS — E785 Hyperlipidemia, unspecified: Secondary | ICD-10-CM | POA: Diagnosis not present

## 2013-12-19 DIAGNOSIS — N183 Chronic kidney disease, stage 3 unspecified: Secondary | ICD-10-CM | POA: Diagnosis not present

## 2013-12-19 DIAGNOSIS — E875 Hyperkalemia: Secondary | ICD-10-CM | POA: Diagnosis not present

## 2013-12-19 DIAGNOSIS — N184 Chronic kidney disease, stage 4 (severe): Secondary | ICD-10-CM | POA: Diagnosis not present

## 2014-01-01 DIAGNOSIS — R599 Enlarged lymph nodes, unspecified: Secondary | ICD-10-CM | POA: Diagnosis not present

## 2014-01-01 DIAGNOSIS — M810 Age-related osteoporosis without current pathological fracture: Secondary | ICD-10-CM | POA: Diagnosis not present

## 2014-01-01 DIAGNOSIS — K055 Other periodontal diseases: Secondary | ICD-10-CM | POA: Diagnosis not present

## 2014-01-01 DIAGNOSIS — M542 Cervicalgia: Secondary | ICD-10-CM | POA: Diagnosis not present

## 2014-01-04 DIAGNOSIS — R070 Pain in throat: Secondary | ICD-10-CM | POA: Diagnosis not present

## 2014-01-04 DIAGNOSIS — R220 Localized swelling, mass and lump, head: Secondary | ICD-10-CM | POA: Diagnosis not present

## 2014-01-04 DIAGNOSIS — J342 Deviated nasal septum: Secondary | ICD-10-CM | POA: Diagnosis not present

## 2014-01-04 DIAGNOSIS — D3701 Neoplasm of uncertain behavior of lip: Secondary | ICD-10-CM | POA: Diagnosis not present

## 2014-01-04 DIAGNOSIS — D3709 Neoplasm of uncertain behavior of other specified sites of the oral cavity: Secondary | ICD-10-CM | POA: Diagnosis not present

## 2014-01-04 DIAGNOSIS — R221 Localized swelling, mass and lump, neck: Secondary | ICD-10-CM | POA: Diagnosis not present

## 2014-01-04 DIAGNOSIS — J387 Other diseases of larynx: Secondary | ICD-10-CM | POA: Diagnosis not present

## 2014-01-08 DIAGNOSIS — H353190 Nonexudative age-related macular degeneration, unspecified eye, stage unspecified: Secondary | ICD-10-CM | POA: Diagnosis not present

## 2014-01-08 DIAGNOSIS — H5210 Myopia, unspecified eye: Secondary | ICD-10-CM | POA: Diagnosis not present

## 2014-01-08 DIAGNOSIS — H3530 Unspecified macular degeneration: Secondary | ICD-10-CM | POA: Diagnosis not present

## 2014-01-10 DIAGNOSIS — E041 Nontoxic single thyroid nodule: Secondary | ICD-10-CM | POA: Diagnosis not present

## 2014-01-10 DIAGNOSIS — R221 Localized swelling, mass and lump, neck: Secondary | ICD-10-CM | POA: Diagnosis not present

## 2014-01-16 DIAGNOSIS — E039 Hypothyroidism, unspecified: Secondary | ICD-10-CM | POA: Diagnosis not present

## 2014-01-16 DIAGNOSIS — N183 Chronic kidney disease, stage 3 (moderate): Secondary | ICD-10-CM | POA: Diagnosis not present

## 2014-01-16 DIAGNOSIS — F329 Major depressive disorder, single episode, unspecified: Secondary | ICD-10-CM | POA: Diagnosis not present

## 2014-01-16 DIAGNOSIS — N200 Calculus of kidney: Secondary | ICD-10-CM | POA: Diagnosis not present

## 2014-01-16 DIAGNOSIS — N390 Urinary tract infection, site not specified: Secondary | ICD-10-CM | POA: Diagnosis not present

## 2014-01-16 DIAGNOSIS — N135 Crossing vessel and stricture of ureter without hydronephrosis: Secondary | ICD-10-CM | POA: Diagnosis not present

## 2014-01-16 DIAGNOSIS — Z79899 Other long term (current) drug therapy: Secondary | ICD-10-CM | POA: Diagnosis not present

## 2014-01-16 DIAGNOSIS — Z48816 Encounter for surgical aftercare following surgery on the genitourinary system: Secondary | ICD-10-CM | POA: Diagnosis not present

## 2014-01-16 DIAGNOSIS — E785 Hyperlipidemia, unspecified: Secondary | ICD-10-CM | POA: Diagnosis not present

## 2014-01-16 DIAGNOSIS — Z7982 Long term (current) use of aspirin: Secondary | ICD-10-CM | POA: Diagnosis not present

## 2014-01-16 DIAGNOSIS — F419 Anxiety disorder, unspecified: Secondary | ICD-10-CM | POA: Diagnosis not present

## 2014-01-16 DIAGNOSIS — Z888 Allergy status to other drugs, medicaments and biological substances status: Secondary | ICD-10-CM | POA: Diagnosis not present

## 2014-01-16 DIAGNOSIS — Z952 Presence of prosthetic heart valve: Secondary | ICD-10-CM | POA: Diagnosis not present

## 2014-01-16 DIAGNOSIS — Z882 Allergy status to sulfonamides status: Secondary | ICD-10-CM | POA: Diagnosis not present

## 2014-01-16 DIAGNOSIS — Z9849 Cataract extraction status, unspecified eye: Secondary | ICD-10-CM | POA: Diagnosis not present

## 2014-01-16 DIAGNOSIS — Z886 Allergy status to analgesic agent status: Secondary | ICD-10-CM | POA: Diagnosis not present

## 2014-01-16 DIAGNOSIS — N281 Cyst of kidney, acquired: Secondary | ICD-10-CM | POA: Diagnosis not present

## 2014-01-16 DIAGNOSIS — Z951 Presence of aortocoronary bypass graft: Secondary | ICD-10-CM | POA: Diagnosis not present

## 2014-01-17 DIAGNOSIS — E041 Nontoxic single thyroid nodule: Secondary | ICD-10-CM | POA: Diagnosis not present

## 2014-01-17 DIAGNOSIS — K1370 Unspecified lesions of oral mucosa: Secondary | ICD-10-CM | POA: Diagnosis not present

## 2014-01-23 DIAGNOSIS — E041 Nontoxic single thyroid nodule: Secondary | ICD-10-CM | POA: Diagnosis not present

## 2014-01-29 DIAGNOSIS — E041 Nontoxic single thyroid nodule: Secondary | ICD-10-CM | POA: Diagnosis not present

## 2014-02-07 DIAGNOSIS — J342 Deviated nasal septum: Secondary | ICD-10-CM | POA: Diagnosis not present

## 2014-02-07 DIAGNOSIS — E041 Nontoxic single thyroid nodule: Secondary | ICD-10-CM | POA: Diagnosis not present

## 2014-02-07 DIAGNOSIS — D440 Neoplasm of uncertain behavior of thyroid gland: Secondary | ICD-10-CM | POA: Diagnosis not present

## 2014-02-14 DIAGNOSIS — E041 Nontoxic single thyroid nodule: Secondary | ICD-10-CM | POA: Diagnosis not present

## 2014-02-14 DIAGNOSIS — Z23 Encounter for immunization: Secondary | ICD-10-CM | POA: Diagnosis not present

## 2014-02-14 DIAGNOSIS — K068 Other specified disorders of gingiva and edentulous alveolar ridge: Secondary | ICD-10-CM | POA: Diagnosis not present

## 2014-02-14 DIAGNOSIS — R590 Localized enlarged lymph nodes: Secondary | ICD-10-CM | POA: Diagnosis not present

## 2014-03-01 DIAGNOSIS — E041 Nontoxic single thyroid nodule: Secondary | ICD-10-CM | POA: Diagnosis not present

## 2014-03-04 DIAGNOSIS — E780 Pure hypercholesterolemia: Secondary | ICD-10-CM | POA: Diagnosis not present

## 2014-03-04 DIAGNOSIS — M810 Age-related osteoporosis without current pathological fracture: Secondary | ICD-10-CM | POA: Diagnosis not present

## 2014-03-04 DIAGNOSIS — E039 Hypothyroidism, unspecified: Secondary | ICD-10-CM | POA: Diagnosis not present

## 2014-03-06 DIAGNOSIS — E559 Vitamin D deficiency, unspecified: Secondary | ICD-10-CM | POA: Diagnosis not present

## 2014-03-06 DIAGNOSIS — E780 Pure hypercholesterolemia: Secondary | ICD-10-CM | POA: Diagnosis not present

## 2014-03-06 DIAGNOSIS — E039 Hypothyroidism, unspecified: Secondary | ICD-10-CM | POA: Diagnosis not present

## 2014-03-06 DIAGNOSIS — D509 Iron deficiency anemia, unspecified: Secondary | ICD-10-CM | POA: Diagnosis not present

## 2014-03-06 DIAGNOSIS — F419 Anxiety disorder, unspecified: Secondary | ICD-10-CM | POA: Diagnosis not present

## 2014-03-11 DIAGNOSIS — E041 Nontoxic single thyroid nodule: Secondary | ICD-10-CM | POA: Diagnosis not present

## 2014-03-15 DIAGNOSIS — H3531 Nonexudative age-related macular degeneration: Secondary | ICD-10-CM | POA: Diagnosis not present

## 2014-03-15 DIAGNOSIS — H35363 Drusen (degenerative) of macula, bilateral: Secondary | ICD-10-CM | POA: Diagnosis not present

## 2014-05-16 DIAGNOSIS — Z954 Presence of other heart-valve replacement: Secondary | ICD-10-CM | POA: Diagnosis not present

## 2014-05-16 DIAGNOSIS — Z952 Presence of prosthetic heart valve: Secondary | ICD-10-CM | POA: Diagnosis not present

## 2014-05-16 DIAGNOSIS — R5383 Other fatigue: Secondary | ICD-10-CM | POA: Diagnosis not present

## 2014-05-16 DIAGNOSIS — R0609 Other forms of dyspnea: Secondary | ICD-10-CM | POA: Diagnosis not present

## 2014-06-03 DIAGNOSIS — K0510 Chronic gingivitis, plaque induced: Secondary | ICD-10-CM | POA: Diagnosis not present

## 2014-06-03 DIAGNOSIS — E041 Nontoxic single thyroid nodule: Secondary | ICD-10-CM | POA: Diagnosis not present

## 2014-06-03 DIAGNOSIS — K219 Gastro-esophageal reflux disease without esophagitis: Secondary | ICD-10-CM | POA: Diagnosis not present

## 2014-06-14 DIAGNOSIS — I129 Hypertensive chronic kidney disease with stage 1 through stage 4 chronic kidney disease, or unspecified chronic kidney disease: Secondary | ICD-10-CM | POA: Diagnosis not present

## 2014-06-14 DIAGNOSIS — N183 Chronic kidney disease, stage 3 (moderate): Secondary | ICD-10-CM | POA: Diagnosis not present

## 2014-06-14 DIAGNOSIS — N184 Chronic kidney disease, stage 4 (severe): Secondary | ICD-10-CM | POA: Diagnosis not present

## 2014-07-18 DIAGNOSIS — H3531 Nonexudative age-related macular degeneration: Secondary | ICD-10-CM | POA: Diagnosis not present

## 2014-07-18 DIAGNOSIS — Z961 Presence of intraocular lens: Secondary | ICD-10-CM | POA: Diagnosis not present

## 2014-07-24 DIAGNOSIS — Z7982 Long term (current) use of aspirin: Secondary | ICD-10-CM | POA: Diagnosis not present

## 2014-07-24 DIAGNOSIS — N200 Calculus of kidney: Secondary | ICD-10-CM | POA: Diagnosis not present

## 2014-07-24 DIAGNOSIS — R109 Unspecified abdominal pain: Secondary | ICD-10-CM | POA: Diagnosis not present

## 2014-07-24 DIAGNOSIS — N183 Chronic kidney disease, stage 3 (moderate): Secondary | ICD-10-CM | POA: Diagnosis not present

## 2014-07-24 DIAGNOSIS — N390 Urinary tract infection, site not specified: Secondary | ICD-10-CM | POA: Diagnosis not present

## 2014-08-12 DIAGNOSIS — E041 Nontoxic single thyroid nodule: Secondary | ICD-10-CM | POA: Diagnosis not present

## 2014-09-03 DIAGNOSIS — M810 Age-related osteoporosis without current pathological fracture: Secondary | ICD-10-CM | POA: Diagnosis not present

## 2014-09-12 DIAGNOSIS — E780 Pure hypercholesterolemia: Secondary | ICD-10-CM | POA: Diagnosis not present

## 2014-09-12 DIAGNOSIS — E039 Hypothyroidism, unspecified: Secondary | ICD-10-CM | POA: Diagnosis not present

## 2014-09-12 DIAGNOSIS — E559 Vitamin D deficiency, unspecified: Secondary | ICD-10-CM | POA: Diagnosis not present

## 2014-09-13 DIAGNOSIS — E559 Vitamin D deficiency, unspecified: Secondary | ICD-10-CM | POA: Diagnosis not present

## 2014-09-13 DIAGNOSIS — Z9181 History of falling: Secondary | ICD-10-CM | POA: Diagnosis not present

## 2014-09-13 DIAGNOSIS — N184 Chronic kidney disease, stage 4 (severe): Secondary | ICD-10-CM | POA: Diagnosis not present

## 2014-09-13 DIAGNOSIS — Z1389 Encounter for screening for other disorder: Secondary | ICD-10-CM | POA: Diagnosis not present

## 2014-09-13 DIAGNOSIS — F419 Anxiety disorder, unspecified: Secondary | ICD-10-CM | POA: Diagnosis not present

## 2014-09-13 DIAGNOSIS — D509 Iron deficiency anemia, unspecified: Secondary | ICD-10-CM | POA: Diagnosis not present

## 2014-09-13 DIAGNOSIS — E780 Pure hypercholesterolemia: Secondary | ICD-10-CM | POA: Diagnosis not present

## 2014-09-13 DIAGNOSIS — E039 Hypothyroidism, unspecified: Secondary | ICD-10-CM | POA: Diagnosis not present

## 2014-09-13 DIAGNOSIS — Z6828 Body mass index (BMI) 28.0-28.9, adult: Secondary | ICD-10-CM | POA: Diagnosis not present

## 2014-10-21 DIAGNOSIS — H9190 Unspecified hearing loss, unspecified ear: Secondary | ICD-10-CM | POA: Diagnosis not present

## 2014-10-21 DIAGNOSIS — H903 Sensorineural hearing loss, bilateral: Secondary | ICD-10-CM | POA: Diagnosis not present

## 2014-11-21 DIAGNOSIS — H3531 Nonexudative age-related macular degeneration: Secondary | ICD-10-CM | POA: Diagnosis not present

## 2014-11-21 DIAGNOSIS — Z961 Presence of intraocular lens: Secondary | ICD-10-CM | POA: Diagnosis not present

## 2014-12-27 DIAGNOSIS — E039 Hypothyroidism, unspecified: Secondary | ICD-10-CM | POA: Diagnosis not present

## 2014-12-27 DIAGNOSIS — N2581 Secondary hyperparathyroidism of renal origin: Secondary | ICD-10-CM | POA: Diagnosis not present

## 2014-12-27 DIAGNOSIS — I129 Hypertensive chronic kidney disease with stage 1 through stage 4 chronic kidney disease, or unspecified chronic kidney disease: Secondary | ICD-10-CM | POA: Diagnosis not present

## 2014-12-27 DIAGNOSIS — Z881 Allergy status to other antibiotic agents status: Secondary | ICD-10-CM | POA: Diagnosis not present

## 2014-12-27 DIAGNOSIS — N184 Chronic kidney disease, stage 4 (severe): Secondary | ICD-10-CM | POA: Diagnosis not present

## 2014-12-27 DIAGNOSIS — N183 Chronic kidney disease, stage 3 (moderate): Secondary | ICD-10-CM | POA: Diagnosis not present

## 2014-12-27 DIAGNOSIS — D649 Anemia, unspecified: Secondary | ICD-10-CM | POA: Diagnosis not present

## 2014-12-27 DIAGNOSIS — Z882 Allergy status to sulfonamides status: Secondary | ICD-10-CM | POA: Diagnosis not present

## 2014-12-27 DIAGNOSIS — Z885 Allergy status to narcotic agent status: Secondary | ICD-10-CM | POA: Diagnosis not present

## 2014-12-27 DIAGNOSIS — E785 Hyperlipidemia, unspecified: Secondary | ICD-10-CM | POA: Diagnosis not present

## 2014-12-27 DIAGNOSIS — Z79899 Other long term (current) drug therapy: Secondary | ICD-10-CM | POA: Diagnosis not present

## 2015-02-25 DIAGNOSIS — Z1231 Encounter for screening mammogram for malignant neoplasm of breast: Secondary | ICD-10-CM | POA: Diagnosis not present

## 2015-03-13 DIAGNOSIS — E039 Hypothyroidism, unspecified: Secondary | ICD-10-CM | POA: Diagnosis not present

## 2015-03-13 DIAGNOSIS — E7800 Pure hypercholesterolemia, unspecified: Secondary | ICD-10-CM | POA: Diagnosis not present

## 2015-03-13 DIAGNOSIS — E559 Vitamin D deficiency, unspecified: Secondary | ICD-10-CM | POA: Diagnosis not present

## 2015-03-17 DIAGNOSIS — R0602 Shortness of breath: Secondary | ICD-10-CM | POA: Diagnosis not present

## 2015-03-17 DIAGNOSIS — R112 Nausea with vomiting, unspecified: Secondary | ICD-10-CM | POA: Diagnosis not present

## 2015-03-17 DIAGNOSIS — E041 Nontoxic single thyroid nodule: Secondary | ICD-10-CM | POA: Diagnosis not present

## 2015-03-19 DIAGNOSIS — Z23 Encounter for immunization: Secondary | ICD-10-CM | POA: Diagnosis not present

## 2015-03-19 DIAGNOSIS — E039 Hypothyroidism, unspecified: Secondary | ICD-10-CM | POA: Diagnosis not present

## 2015-03-19 DIAGNOSIS — E559 Vitamin D deficiency, unspecified: Secondary | ICD-10-CM | POA: Diagnosis not present

## 2015-03-19 DIAGNOSIS — M542 Cervicalgia: Secondary | ICD-10-CM | POA: Diagnosis not present

## 2015-03-19 DIAGNOSIS — Z6828 Body mass index (BMI) 28.0-28.9, adult: Secondary | ICD-10-CM | POA: Diagnosis not present

## 2015-03-19 DIAGNOSIS — N184 Chronic kidney disease, stage 4 (severe): Secondary | ICD-10-CM | POA: Diagnosis not present

## 2015-03-19 DIAGNOSIS — Z1389 Encounter for screening for other disorder: Secondary | ICD-10-CM | POA: Diagnosis not present

## 2015-03-19 DIAGNOSIS — D509 Iron deficiency anemia, unspecified: Secondary | ICD-10-CM | POA: Diagnosis not present

## 2015-03-19 DIAGNOSIS — M810 Age-related osteoporosis without current pathological fracture: Secondary | ICD-10-CM | POA: Diagnosis not present

## 2015-03-19 DIAGNOSIS — F419 Anxiety disorder, unspecified: Secondary | ICD-10-CM | POA: Diagnosis not present

## 2015-03-19 DIAGNOSIS — E7800 Pure hypercholesterolemia, unspecified: Secondary | ICD-10-CM | POA: Diagnosis not present

## 2015-03-21 DIAGNOSIS — Z954 Presence of other heart-valve replacement: Secondary | ICD-10-CM | POA: Diagnosis not present

## 2015-03-21 DIAGNOSIS — Z951 Presence of aortocoronary bypass graft: Secondary | ICD-10-CM | POA: Diagnosis not present

## 2015-03-21 DIAGNOSIS — R001 Bradycardia, unspecified: Secondary | ICD-10-CM | POA: Diagnosis not present

## 2015-03-21 DIAGNOSIS — R079 Chest pain, unspecified: Secondary | ICD-10-CM | POA: Diagnosis not present

## 2015-03-21 DIAGNOSIS — N183 Chronic kidney disease, stage 3 (moderate): Secondary | ICD-10-CM | POA: Diagnosis not present

## 2015-03-21 DIAGNOSIS — Z952 Presence of prosthetic heart valve: Secondary | ICD-10-CM | POA: Diagnosis not present

## 2015-03-21 DIAGNOSIS — R070 Pain in throat: Secondary | ICD-10-CM | POA: Diagnosis not present

## 2015-03-21 DIAGNOSIS — R112 Nausea with vomiting, unspecified: Secondary | ICD-10-CM | POA: Diagnosis not present

## 2015-03-25 DIAGNOSIS — Z961 Presence of intraocular lens: Secondary | ICD-10-CM | POA: Diagnosis not present

## 2015-03-25 DIAGNOSIS — H353130 Nonexudative age-related macular degeneration, bilateral, stage unspecified: Secondary | ICD-10-CM | POA: Diagnosis not present

## 2015-03-25 DIAGNOSIS — H43813 Vitreous degeneration, bilateral: Secondary | ICD-10-CM | POA: Diagnosis not present

## 2015-04-01 DIAGNOSIS — I10 Essential (primary) hypertension: Secondary | ICD-10-CM | POA: Diagnosis not present

## 2015-04-01 DIAGNOSIS — R079 Chest pain, unspecified: Secondary | ICD-10-CM | POA: Diagnosis not present

## 2015-04-01 DIAGNOSIS — E785 Hyperlipidemia, unspecified: Secondary | ICD-10-CM | POA: Diagnosis not present

## 2015-04-01 DIAGNOSIS — N189 Chronic kidney disease, unspecified: Secondary | ICD-10-CM | POA: Diagnosis not present

## 2015-04-01 DIAGNOSIS — E039 Hypothyroidism, unspecified: Secondary | ICD-10-CM | POA: Diagnosis not present

## 2015-04-01 DIAGNOSIS — I700 Atherosclerosis of aorta: Secondary | ICD-10-CM | POA: Diagnosis not present

## 2015-04-01 DIAGNOSIS — I071 Rheumatic tricuspid insufficiency: Secondary | ICD-10-CM | POA: Diagnosis not present

## 2015-04-01 DIAGNOSIS — Z952 Presence of prosthetic heart valve: Secondary | ICD-10-CM | POA: Diagnosis not present

## 2015-04-01 DIAGNOSIS — Z951 Presence of aortocoronary bypass graft: Secondary | ICD-10-CM | POA: Diagnosis not present

## 2015-04-25 DIAGNOSIS — R0602 Shortness of breath: Secondary | ICD-10-CM | POA: Diagnosis not present

## 2015-04-25 DIAGNOSIS — I2583 Coronary atherosclerosis due to lipid rich plaque: Secondary | ICD-10-CM | POA: Diagnosis not present

## 2015-04-25 DIAGNOSIS — I2510 Atherosclerotic heart disease of native coronary artery without angina pectoris: Secondary | ICD-10-CM | POA: Diagnosis not present

## 2015-04-25 DIAGNOSIS — Z954 Presence of other heart-valve replacement: Secondary | ICD-10-CM | POA: Diagnosis not present

## 2015-05-22 DIAGNOSIS — Z952 Presence of prosthetic heart valve: Secondary | ICD-10-CM | POA: Diagnosis not present

## 2015-05-22 DIAGNOSIS — I951 Orthostatic hypotension: Secondary | ICD-10-CM | POA: Diagnosis not present

## 2015-06-24 DIAGNOSIS — H353190 Nonexudative age-related macular degeneration, unspecified eye, stage unspecified: Secondary | ICD-10-CM | POA: Diagnosis not present

## 2015-06-24 DIAGNOSIS — H43813 Vitreous degeneration, bilateral: Secondary | ICD-10-CM | POA: Diagnosis not present

## 2015-06-24 DIAGNOSIS — H35372 Puckering of macula, left eye: Secondary | ICD-10-CM | POA: Diagnosis not present

## 2015-06-24 DIAGNOSIS — Z961 Presence of intraocular lens: Secondary | ICD-10-CM | POA: Diagnosis not present

## 2015-07-23 DIAGNOSIS — N183 Chronic kidney disease, stage 3 (moderate): Secondary | ICD-10-CM | POA: Diagnosis not present

## 2015-07-23 DIAGNOSIS — N135 Crossing vessel and stricture of ureter without hydronephrosis: Secondary | ICD-10-CM | POA: Diagnosis not present

## 2015-07-23 DIAGNOSIS — N1330 Unspecified hydronephrosis: Secondary | ICD-10-CM | POA: Diagnosis not present

## 2015-08-27 DIAGNOSIS — E875 Hyperkalemia: Secondary | ICD-10-CM | POA: Diagnosis not present

## 2015-08-27 DIAGNOSIS — N179 Acute kidney failure, unspecified: Secondary | ICD-10-CM | POA: Diagnosis not present

## 2015-08-27 DIAGNOSIS — Z881 Allergy status to other antibiotic agents status: Secondary | ICD-10-CM | POA: Diagnosis not present

## 2015-08-27 DIAGNOSIS — Z79899 Other long term (current) drug therapy: Secondary | ICD-10-CM | POA: Diagnosis not present

## 2015-08-27 DIAGNOSIS — N184 Chronic kidney disease, stage 4 (severe): Secondary | ICD-10-CM | POA: Diagnosis not present

## 2015-08-27 DIAGNOSIS — D649 Anemia, unspecified: Secondary | ICD-10-CM | POA: Diagnosis not present

## 2015-08-27 DIAGNOSIS — N141 Nephropathy induced by other drugs, medicaments and biological substances: Secondary | ICD-10-CM | POA: Diagnosis not present

## 2015-08-27 DIAGNOSIS — I509 Heart failure, unspecified: Secondary | ICD-10-CM | POA: Diagnosis not present

## 2015-08-27 DIAGNOSIS — Z885 Allergy status to narcotic agent status: Secondary | ICD-10-CM | POA: Diagnosis not present

## 2015-08-27 DIAGNOSIS — Z7982 Long term (current) use of aspirin: Secondary | ICD-10-CM | POA: Diagnosis not present

## 2015-08-27 DIAGNOSIS — E039 Hypothyroidism, unspecified: Secondary | ICD-10-CM | POA: Diagnosis not present

## 2015-08-27 DIAGNOSIS — Z882 Allergy status to sulfonamides status: Secondary | ICD-10-CM | POA: Diagnosis not present

## 2015-08-27 DIAGNOSIS — N183 Chronic kidney disease, stage 3 (moderate): Secondary | ICD-10-CM | POA: Diagnosis not present

## 2015-08-27 DIAGNOSIS — I130 Hypertensive heart and chronic kidney disease with heart failure and stage 1 through stage 4 chronic kidney disease, or unspecified chronic kidney disease: Secondary | ICD-10-CM | POA: Diagnosis not present

## 2015-08-27 DIAGNOSIS — E785 Hyperlipidemia, unspecified: Secondary | ICD-10-CM | POA: Diagnosis not present

## 2015-09-30 DIAGNOSIS — H43813 Vitreous degeneration, bilateral: Secondary | ICD-10-CM | POA: Diagnosis not present

## 2015-09-30 DIAGNOSIS — Z961 Presence of intraocular lens: Secondary | ICD-10-CM | POA: Diagnosis not present

## 2015-09-30 DIAGNOSIS — H5213 Myopia, bilateral: Secondary | ICD-10-CM | POA: Diagnosis not present

## 2015-09-30 DIAGNOSIS — M810 Age-related osteoporosis without current pathological fracture: Secondary | ICD-10-CM | POA: Diagnosis not present

## 2015-09-30 DIAGNOSIS — H353132 Nonexudative age-related macular degeneration, bilateral, intermediate dry stage: Secondary | ICD-10-CM | POA: Diagnosis not present

## 2015-10-28 DIAGNOSIS — E7800 Pure hypercholesterolemia, unspecified: Secondary | ICD-10-CM | POA: Diagnosis not present

## 2015-10-28 DIAGNOSIS — E559 Vitamin D deficiency, unspecified: Secondary | ICD-10-CM | POA: Diagnosis not present

## 2015-10-28 DIAGNOSIS — E039 Hypothyroidism, unspecified: Secondary | ICD-10-CM | POA: Diagnosis not present

## 2015-10-31 DIAGNOSIS — N184 Chronic kidney disease, stage 4 (severe): Secondary | ICD-10-CM | POA: Diagnosis not present

## 2015-10-31 DIAGNOSIS — D509 Iron deficiency anemia, unspecified: Secondary | ICD-10-CM | POA: Diagnosis not present

## 2015-10-31 DIAGNOSIS — Z9181 History of falling: Secondary | ICD-10-CM | POA: Diagnosis not present

## 2015-10-31 DIAGNOSIS — M810 Age-related osteoporosis without current pathological fracture: Secondary | ICD-10-CM | POA: Diagnosis not present

## 2015-10-31 DIAGNOSIS — Z6829 Body mass index (BMI) 29.0-29.9, adult: Secondary | ICD-10-CM | POA: Diagnosis not present

## 2015-10-31 DIAGNOSIS — E7800 Pure hypercholesterolemia, unspecified: Secondary | ICD-10-CM | POA: Diagnosis not present

## 2015-10-31 DIAGNOSIS — F419 Anxiety disorder, unspecified: Secondary | ICD-10-CM | POA: Diagnosis not present

## 2015-10-31 DIAGNOSIS — E039 Hypothyroidism, unspecified: Secondary | ICD-10-CM | POA: Diagnosis not present

## 2015-11-06 DIAGNOSIS — M810 Age-related osteoporosis without current pathological fracture: Secondary | ICD-10-CM | POA: Diagnosis not present

## 2016-02-04 DIAGNOSIS — R509 Fever, unspecified: Secondary | ICD-10-CM | POA: Diagnosis not present

## 2016-02-04 DIAGNOSIS — I2510 Atherosclerotic heart disease of native coronary artery without angina pectoris: Secondary | ICD-10-CM | POA: Diagnosis not present

## 2016-02-04 DIAGNOSIS — M810 Age-related osteoporosis without current pathological fracture: Secondary | ICD-10-CM | POA: Diagnosis not present

## 2016-02-04 DIAGNOSIS — R0989 Other specified symptoms and signs involving the circulatory and respiratory systems: Secondary | ICD-10-CM | POA: Diagnosis not present

## 2016-02-04 DIAGNOSIS — N189 Chronic kidney disease, unspecified: Secondary | ICD-10-CM | POA: Diagnosis not present

## 2016-02-04 DIAGNOSIS — Z86718 Personal history of other venous thrombosis and embolism: Secondary | ICD-10-CM | POA: Diagnosis not present

## 2016-02-04 DIAGNOSIS — I509 Heart failure, unspecified: Secondary | ICD-10-CM | POA: Diagnosis not present

## 2016-02-04 DIAGNOSIS — Z7982 Long term (current) use of aspirin: Secondary | ICD-10-CM | POA: Diagnosis not present

## 2016-02-04 DIAGNOSIS — J069 Acute upper respiratory infection, unspecified: Secondary | ICD-10-CM | POA: Diagnosis not present

## 2016-02-04 DIAGNOSIS — R1110 Vomiting, unspecified: Secondary | ICD-10-CM | POA: Diagnosis not present

## 2016-02-04 DIAGNOSIS — R05 Cough: Secondary | ICD-10-CM | POA: Diagnosis not present

## 2016-02-04 DIAGNOSIS — F419 Anxiety disorder, unspecified: Secondary | ICD-10-CM | POA: Diagnosis not present

## 2016-02-04 DIAGNOSIS — K219 Gastro-esophageal reflux disease without esophagitis: Secondary | ICD-10-CM | POA: Diagnosis not present

## 2016-02-04 DIAGNOSIS — Z6828 Body mass index (BMI) 28.0-28.9, adult: Secondary | ICD-10-CM | POA: Diagnosis not present

## 2016-02-04 DIAGNOSIS — M1990 Unspecified osteoarthritis, unspecified site: Secondary | ICD-10-CM | POA: Diagnosis not present

## 2016-02-04 DIAGNOSIS — Z79899 Other long term (current) drug therapy: Secondary | ICD-10-CM | POA: Diagnosis not present

## 2016-02-04 DIAGNOSIS — D649 Anemia, unspecified: Secondary | ICD-10-CM | POA: Diagnosis not present

## 2016-02-09 DIAGNOSIS — J069 Acute upper respiratory infection, unspecified: Secondary | ICD-10-CM | POA: Diagnosis not present

## 2016-02-09 DIAGNOSIS — Z6828 Body mass index (BMI) 28.0-28.9, adult: Secondary | ICD-10-CM | POA: Diagnosis not present

## 2016-02-18 DIAGNOSIS — E039 Hypothyroidism, unspecified: Secondary | ICD-10-CM | POA: Diagnosis not present

## 2016-02-18 DIAGNOSIS — D649 Anemia, unspecified: Secondary | ICD-10-CM | POA: Diagnosis not present

## 2016-02-18 DIAGNOSIS — Z7982 Long term (current) use of aspirin: Secondary | ICD-10-CM | POA: Diagnosis not present

## 2016-02-18 DIAGNOSIS — F418 Other specified anxiety disorders: Secondary | ICD-10-CM | POA: Diagnosis not present

## 2016-02-18 DIAGNOSIS — R809 Proteinuria, unspecified: Secondary | ICD-10-CM | POA: Diagnosis not present

## 2016-02-18 DIAGNOSIS — N183 Chronic kidney disease, stage 3 (moderate): Secondary | ICD-10-CM | POA: Diagnosis not present

## 2016-02-18 DIAGNOSIS — I38 Endocarditis, valve unspecified: Secondary | ICD-10-CM | POA: Diagnosis not present

## 2016-02-18 DIAGNOSIS — E785 Hyperlipidemia, unspecified: Secondary | ICD-10-CM | POA: Diagnosis not present

## 2016-02-18 DIAGNOSIS — Z7189 Other specified counseling: Secondary | ICD-10-CM | POA: Diagnosis not present

## 2016-02-18 DIAGNOSIS — Z87440 Personal history of urinary (tract) infections: Secondary | ICD-10-CM | POA: Diagnosis not present

## 2016-02-18 DIAGNOSIS — N2581 Secondary hyperparathyroidism of renal origin: Secondary | ICD-10-CM | POA: Diagnosis not present

## 2016-02-18 DIAGNOSIS — N184 Chronic kidney disease, stage 4 (severe): Secondary | ICD-10-CM | POA: Diagnosis not present

## 2016-02-24 DIAGNOSIS — Z8679 Personal history of other diseases of the circulatory system: Secondary | ICD-10-CM | POA: Diagnosis not present

## 2016-02-24 DIAGNOSIS — R350 Frequency of micturition: Secondary | ICD-10-CM | POA: Diagnosis not present

## 2016-02-24 DIAGNOSIS — I509 Heart failure, unspecified: Secondary | ICD-10-CM | POA: Diagnosis not present

## 2016-02-24 DIAGNOSIS — I2510 Atherosclerotic heart disease of native coronary artery without angina pectoris: Secondary | ICD-10-CM | POA: Diagnosis not present

## 2016-02-24 DIAGNOSIS — R0602 Shortness of breath: Secondary | ICD-10-CM | POA: Diagnosis not present

## 2016-03-01 DIAGNOSIS — Z8679 Personal history of other diseases of the circulatory system: Secondary | ICD-10-CM | POA: Diagnosis not present

## 2016-03-01 DIAGNOSIS — R0602 Shortness of breath: Secondary | ICD-10-CM | POA: Diagnosis not present

## 2016-03-01 DIAGNOSIS — I330 Acute and subacute infective endocarditis: Secondary | ICD-10-CM | POA: Diagnosis not present

## 2016-03-01 DIAGNOSIS — J069 Acute upper respiratory infection, unspecified: Secondary | ICD-10-CM | POA: Diagnosis not present

## 2016-03-01 DIAGNOSIS — R5383 Other fatigue: Secondary | ICD-10-CM | POA: Diagnosis not present

## 2016-03-01 DIAGNOSIS — Z952 Presence of prosthetic heart valve: Secondary | ICD-10-CM | POA: Diagnosis not present

## 2016-03-01 DIAGNOSIS — Z8709 Personal history of other diseases of the respiratory system: Secondary | ICD-10-CM | POA: Diagnosis not present

## 2016-03-01 DIAGNOSIS — I951 Orthostatic hypotension: Secondary | ICD-10-CM | POA: Diagnosis not present

## 2016-03-16 DIAGNOSIS — H35372 Puckering of macula, left eye: Secondary | ICD-10-CM | POA: Diagnosis not present

## 2016-03-16 DIAGNOSIS — H5213 Myopia, bilateral: Secondary | ICD-10-CM | POA: Diagnosis not present

## 2016-03-16 DIAGNOSIS — Z961 Presence of intraocular lens: Secondary | ICD-10-CM | POA: Diagnosis not present

## 2016-03-16 DIAGNOSIS — H353132 Nonexudative age-related macular degeneration, bilateral, intermediate dry stage: Secondary | ICD-10-CM | POA: Diagnosis not present

## 2016-03-16 DIAGNOSIS — H43813 Vitreous degeneration, bilateral: Secondary | ICD-10-CM | POA: Diagnosis not present

## 2016-03-17 DIAGNOSIS — Z23 Encounter for immunization: Secondary | ICD-10-CM | POA: Diagnosis not present

## 2016-03-22 DIAGNOSIS — J392 Other diseases of pharynx: Secondary | ICD-10-CM | POA: Diagnosis not present

## 2016-03-22 DIAGNOSIS — E041 Nontoxic single thyroid nodule: Secondary | ICD-10-CM | POA: Diagnosis not present

## 2016-03-22 DIAGNOSIS — R252 Cramp and spasm: Secondary | ICD-10-CM | POA: Diagnosis not present

## 2016-03-23 DIAGNOSIS — N183 Chronic kidney disease, stage 3 (moderate): Secondary | ICD-10-CM | POA: Diagnosis not present

## 2016-03-23 DIAGNOSIS — E041 Nontoxic single thyroid nodule: Secondary | ICD-10-CM | POA: Diagnosis not present

## 2016-03-23 DIAGNOSIS — Z885 Allergy status to narcotic agent status: Secondary | ICD-10-CM | POA: Diagnosis not present

## 2016-03-23 DIAGNOSIS — E039 Hypothyroidism, unspecified: Secondary | ICD-10-CM | POA: Diagnosis not present

## 2016-03-23 DIAGNOSIS — E785 Hyperlipidemia, unspecified: Secondary | ICD-10-CM | POA: Diagnosis not present

## 2016-03-23 DIAGNOSIS — Z7982 Long term (current) use of aspirin: Secondary | ICD-10-CM | POA: Diagnosis not present

## 2016-03-23 DIAGNOSIS — Z79899 Other long term (current) drug therapy: Secondary | ICD-10-CM | POA: Diagnosis not present

## 2016-03-23 DIAGNOSIS — Z881 Allergy status to other antibiotic agents status: Secondary | ICD-10-CM | POA: Diagnosis not present

## 2016-03-23 DIAGNOSIS — R1310 Dysphagia, unspecified: Secondary | ICD-10-CM | POA: Diagnosis not present

## 2016-03-23 DIAGNOSIS — J392 Other diseases of pharynx: Secondary | ICD-10-CM | POA: Diagnosis not present

## 2016-03-23 DIAGNOSIS — R252 Cramp and spasm: Secondary | ICD-10-CM | POA: Diagnosis not present

## 2016-03-23 DIAGNOSIS — Z882 Allergy status to sulfonamides status: Secondary | ICD-10-CM | POA: Diagnosis not present

## 2016-05-03 DIAGNOSIS — E7800 Pure hypercholesterolemia, unspecified: Secondary | ICD-10-CM | POA: Diagnosis not present

## 2016-05-03 DIAGNOSIS — N184 Chronic kidney disease, stage 4 (severe): Secondary | ICD-10-CM | POA: Diagnosis not present

## 2016-05-03 DIAGNOSIS — E559 Vitamin D deficiency, unspecified: Secondary | ICD-10-CM | POA: Diagnosis not present

## 2016-05-03 DIAGNOSIS — E039 Hypothyroidism, unspecified: Secondary | ICD-10-CM | POA: Diagnosis not present

## 2016-05-10 DIAGNOSIS — Z23 Encounter for immunization: Secondary | ICD-10-CM | POA: Diagnosis not present

## 2016-05-10 DIAGNOSIS — I2510 Atherosclerotic heart disease of native coronary artery without angina pectoris: Secondary | ICD-10-CM | POA: Diagnosis not present

## 2016-05-10 DIAGNOSIS — F419 Anxiety disorder, unspecified: Secondary | ICD-10-CM | POA: Diagnosis not present

## 2016-05-10 DIAGNOSIS — Z1389 Encounter for screening for other disorder: Secondary | ICD-10-CM | POA: Diagnosis not present

## 2016-05-10 DIAGNOSIS — I509 Heart failure, unspecified: Secondary | ICD-10-CM | POA: Diagnosis not present

## 2016-05-10 DIAGNOSIS — D509 Iron deficiency anemia, unspecified: Secondary | ICD-10-CM | POA: Diagnosis not present

## 2016-05-10 DIAGNOSIS — M810 Age-related osteoporosis without current pathological fracture: Secondary | ICD-10-CM | POA: Diagnosis not present

## 2016-05-10 DIAGNOSIS — E039 Hypothyroidism, unspecified: Secondary | ICD-10-CM | POA: Diagnosis not present

## 2016-05-10 DIAGNOSIS — Z9181 History of falling: Secondary | ICD-10-CM | POA: Diagnosis not present

## 2016-05-10 DIAGNOSIS — Z6828 Body mass index (BMI) 28.0-28.9, adult: Secondary | ICD-10-CM | POA: Diagnosis not present

## 2016-05-10 DIAGNOSIS — E7800 Pure hypercholesterolemia, unspecified: Secondary | ICD-10-CM | POA: Diagnosis not present

## 2016-05-10 DIAGNOSIS — K219 Gastro-esophageal reflux disease without esophagitis: Secondary | ICD-10-CM | POA: Diagnosis not present

## 2016-06-03 DIAGNOSIS — Z1231 Encounter for screening mammogram for malignant neoplasm of breast: Secondary | ICD-10-CM | POA: Diagnosis not present

## 2016-06-29 DIAGNOSIS — H353132 Nonexudative age-related macular degeneration, bilateral, intermediate dry stage: Secondary | ICD-10-CM | POA: Diagnosis not present

## 2016-06-29 DIAGNOSIS — H43813 Vitreous degeneration, bilateral: Secondary | ICD-10-CM | POA: Diagnosis not present

## 2016-06-29 DIAGNOSIS — H35372 Puckering of macula, left eye: Secondary | ICD-10-CM | POA: Diagnosis not present

## 2016-06-29 DIAGNOSIS — Z961 Presence of intraocular lens: Secondary | ICD-10-CM | POA: Diagnosis not present

## 2016-06-30 DIAGNOSIS — K219 Gastro-esophageal reflux disease without esophagitis: Secondary | ICD-10-CM | POA: Diagnosis not present

## 2016-06-30 DIAGNOSIS — N183 Chronic kidney disease, stage 3 (moderate): Secondary | ICD-10-CM | POA: Diagnosis not present

## 2016-06-30 DIAGNOSIS — Z79899 Other long term (current) drug therapy: Secondary | ICD-10-CM | POA: Diagnosis not present

## 2016-07-01 DIAGNOSIS — I361 Nonrheumatic tricuspid (valve) insufficiency: Secondary | ICD-10-CM | POA: Diagnosis not present

## 2016-07-01 DIAGNOSIS — I272 Pulmonary hypertension, unspecified: Secondary | ICD-10-CM | POA: Diagnosis not present

## 2016-07-01 DIAGNOSIS — Z952 Presence of prosthetic heart valve: Secondary | ICD-10-CM | POA: Diagnosis not present

## 2016-07-21 DIAGNOSIS — N1339 Other hydronephrosis: Secondary | ICD-10-CM | POA: Diagnosis not present

## 2016-07-21 DIAGNOSIS — N1330 Unspecified hydronephrosis: Secondary | ICD-10-CM | POA: Diagnosis not present

## 2016-07-21 DIAGNOSIS — N183 Chronic kidney disease, stage 3 (moderate): Secondary | ICD-10-CM | POA: Diagnosis not present

## 2016-07-21 DIAGNOSIS — N135 Crossing vessel and stricture of ureter without hydronephrosis: Secondary | ICD-10-CM | POA: Diagnosis not present

## 2016-09-24 DIAGNOSIS — Z136 Encounter for screening for cardiovascular disorders: Secondary | ICD-10-CM | POA: Diagnosis not present

## 2016-09-24 DIAGNOSIS — Z0000 Encounter for general adult medical examination without abnormal findings: Secondary | ICD-10-CM | POA: Diagnosis not present

## 2016-09-24 DIAGNOSIS — Z9181 History of falling: Secondary | ICD-10-CM | POA: Diagnosis not present

## 2016-09-24 DIAGNOSIS — Z1389 Encounter for screening for other disorder: Secondary | ICD-10-CM | POA: Diagnosis not present

## 2016-09-24 DIAGNOSIS — E785 Hyperlipidemia, unspecified: Secondary | ICD-10-CM | POA: Diagnosis not present

## 2016-11-08 DIAGNOSIS — E559 Vitamin D deficiency, unspecified: Secondary | ICD-10-CM | POA: Diagnosis not present

## 2016-11-08 DIAGNOSIS — E7800 Pure hypercholesterolemia, unspecified: Secondary | ICD-10-CM | POA: Diagnosis not present

## 2016-11-08 DIAGNOSIS — N184 Chronic kidney disease, stage 4 (severe): Secondary | ICD-10-CM | POA: Diagnosis not present

## 2016-11-08 DIAGNOSIS — E039 Hypothyroidism, unspecified: Secondary | ICD-10-CM | POA: Diagnosis not present

## 2016-11-08 DIAGNOSIS — Z79899 Other long term (current) drug therapy: Secondary | ICD-10-CM | POA: Diagnosis not present

## 2016-11-15 DIAGNOSIS — Z952 Presence of prosthetic heart valve: Secondary | ICD-10-CM | POA: Diagnosis not present

## 2016-11-15 DIAGNOSIS — M810 Age-related osteoporosis without current pathological fracture: Secondary | ICD-10-CM | POA: Diagnosis not present

## 2016-11-15 DIAGNOSIS — I509 Heart failure, unspecified: Secondary | ICD-10-CM | POA: Diagnosis not present

## 2016-11-15 DIAGNOSIS — D509 Iron deficiency anemia, unspecified: Secondary | ICD-10-CM | POA: Diagnosis not present

## 2016-11-15 DIAGNOSIS — E7800 Pure hypercholesterolemia, unspecified: Secondary | ICD-10-CM | POA: Diagnosis not present

## 2016-11-15 DIAGNOSIS — Z23 Encounter for immunization: Secondary | ICD-10-CM | POA: Diagnosis not present

## 2016-11-15 DIAGNOSIS — I2510 Atherosclerotic heart disease of native coronary artery without angina pectoris: Secondary | ICD-10-CM | POA: Diagnosis not present

## 2016-11-15 DIAGNOSIS — E039 Hypothyroidism, unspecified: Secondary | ICD-10-CM | POA: Diagnosis not present

## 2016-11-15 DIAGNOSIS — N184 Chronic kidney disease, stage 4 (severe): Secondary | ICD-10-CM | POA: Diagnosis not present

## 2016-11-15 DIAGNOSIS — F419 Anxiety disorder, unspecified: Secondary | ICD-10-CM | POA: Diagnosis not present

## 2016-11-15 DIAGNOSIS — Z6829 Body mass index (BMI) 29.0-29.9, adult: Secondary | ICD-10-CM | POA: Diagnosis not present

## 2016-12-07 DIAGNOSIS — H353132 Nonexudative age-related macular degeneration, bilateral, intermediate dry stage: Secondary | ICD-10-CM | POA: Diagnosis not present

## 2016-12-07 DIAGNOSIS — Z961 Presence of intraocular lens: Secondary | ICD-10-CM | POA: Diagnosis not present

## 2016-12-07 DIAGNOSIS — H43813 Vitreous degeneration, bilateral: Secondary | ICD-10-CM | POA: Diagnosis not present

## 2016-12-07 DIAGNOSIS — H35372 Puckering of macula, left eye: Secondary | ICD-10-CM | POA: Diagnosis not present

## 2017-01-21 DIAGNOSIS — Z23 Encounter for immunization: Secondary | ICD-10-CM | POA: Diagnosis not present

## 2017-01-21 DIAGNOSIS — D509 Iron deficiency anemia, unspecified: Secondary | ICD-10-CM | POA: Diagnosis not present

## 2017-01-21 DIAGNOSIS — N2581 Secondary hyperparathyroidism of renal origin: Secondary | ICD-10-CM | POA: Diagnosis not present

## 2017-01-21 DIAGNOSIS — N183 Chronic kidney disease, stage 3 (moderate): Secondary | ICD-10-CM | POA: Diagnosis not present

## 2017-01-21 DIAGNOSIS — E785 Hyperlipidemia, unspecified: Secondary | ICD-10-CM | POA: Diagnosis not present

## 2017-01-21 DIAGNOSIS — E875 Hyperkalemia: Secondary | ICD-10-CM | POA: Diagnosis not present

## 2017-01-21 DIAGNOSIS — I129 Hypertensive chronic kidney disease with stage 1 through stage 4 chronic kidney disease, or unspecified chronic kidney disease: Secondary | ICD-10-CM | POA: Diagnosis not present

## 2017-01-21 DIAGNOSIS — R808 Other proteinuria: Secondary | ICD-10-CM | POA: Diagnosis not present

## 2017-04-25 DIAGNOSIS — E041 Nontoxic single thyroid nodule: Secondary | ICD-10-CM | POA: Diagnosis not present

## 2017-05-10 DIAGNOSIS — H43813 Vitreous degeneration, bilateral: Secondary | ICD-10-CM | POA: Diagnosis not present

## 2017-05-10 DIAGNOSIS — Z961 Presence of intraocular lens: Secondary | ICD-10-CM | POA: Diagnosis not present

## 2017-05-10 DIAGNOSIS — H35372 Puckering of macula, left eye: Secondary | ICD-10-CM | POA: Diagnosis not present

## 2017-05-10 DIAGNOSIS — H353132 Nonexudative age-related macular degeneration, bilateral, intermediate dry stage: Secondary | ICD-10-CM | POA: Diagnosis not present

## 2017-05-17 DIAGNOSIS — N184 Chronic kidney disease, stage 4 (severe): Secondary | ICD-10-CM | POA: Diagnosis not present

## 2017-05-17 DIAGNOSIS — E7800 Pure hypercholesterolemia, unspecified: Secondary | ICD-10-CM | POA: Diagnosis not present

## 2017-05-17 DIAGNOSIS — E039 Hypothyroidism, unspecified: Secondary | ICD-10-CM | POA: Diagnosis not present

## 2017-05-18 DIAGNOSIS — J383 Other diseases of vocal cords: Secondary | ICD-10-CM | POA: Diagnosis not present

## 2017-05-20 DIAGNOSIS — I2510 Atherosclerotic heart disease of native coronary artery without angina pectoris: Secondary | ICD-10-CM | POA: Diagnosis not present

## 2017-05-20 DIAGNOSIS — M810 Age-related osteoporosis without current pathological fracture: Secondary | ICD-10-CM | POA: Diagnosis not present

## 2017-05-20 DIAGNOSIS — I509 Heart failure, unspecified: Secondary | ICD-10-CM | POA: Diagnosis not present

## 2017-05-20 DIAGNOSIS — Z1331 Encounter for screening for depression: Secondary | ICD-10-CM | POA: Diagnosis not present

## 2017-05-20 DIAGNOSIS — N184 Chronic kidney disease, stage 4 (severe): Secondary | ICD-10-CM | POA: Diagnosis not present

## 2017-05-20 DIAGNOSIS — E7800 Pure hypercholesterolemia, unspecified: Secondary | ICD-10-CM | POA: Diagnosis not present

## 2017-05-20 DIAGNOSIS — F419 Anxiety disorder, unspecified: Secondary | ICD-10-CM | POA: Diagnosis not present

## 2017-05-20 DIAGNOSIS — E039 Hypothyroidism, unspecified: Secondary | ICD-10-CM | POA: Diagnosis not present

## 2017-06-09 DIAGNOSIS — M810 Age-related osteoporosis without current pathological fracture: Secondary | ICD-10-CM | POA: Diagnosis not present

## 2017-06-15 DIAGNOSIS — F419 Anxiety disorder, unspecified: Secondary | ICD-10-CM | POA: Diagnosis not present

## 2017-06-15 DIAGNOSIS — E860 Dehydration: Secondary | ICD-10-CM | POA: Diagnosis not present

## 2017-06-15 DIAGNOSIS — J101 Influenza due to other identified influenza virus with other respiratory manifestations: Secondary | ICD-10-CM | POA: Diagnosis not present

## 2017-06-15 DIAGNOSIS — I2510 Atherosclerotic heart disease of native coronary artery without angina pectoris: Secondary | ICD-10-CM | POA: Diagnosis not present

## 2017-06-15 DIAGNOSIS — Z882 Allergy status to sulfonamides status: Secondary | ICD-10-CM | POA: Diagnosis not present

## 2017-06-15 DIAGNOSIS — J111 Influenza due to unidentified influenza virus with other respiratory manifestations: Secondary | ICD-10-CM | POA: Diagnosis not present

## 2017-06-15 DIAGNOSIS — Z7982 Long term (current) use of aspirin: Secondary | ICD-10-CM | POA: Diagnosis not present

## 2017-06-15 DIAGNOSIS — K219 Gastro-esophageal reflux disease without esophagitis: Secondary | ICD-10-CM | POA: Diagnosis not present

## 2017-06-15 DIAGNOSIS — M8440XA Pathological fracture, unspecified site, initial encounter for fracture: Secondary | ICD-10-CM | POA: Diagnosis not present

## 2017-06-15 DIAGNOSIS — N183 Chronic kidney disease, stage 3 (moderate): Secondary | ICD-10-CM | POA: Diagnosis not present

## 2017-06-15 DIAGNOSIS — F329 Major depressive disorder, single episode, unspecified: Secondary | ICD-10-CM | POA: Diagnosis not present

## 2017-06-15 DIAGNOSIS — Z885 Allergy status to narcotic agent status: Secondary | ICD-10-CM | POA: Diagnosis not present

## 2017-06-15 DIAGNOSIS — I509 Heart failure, unspecified: Secondary | ICD-10-CM | POA: Diagnosis not present

## 2017-06-15 DIAGNOSIS — I700 Atherosclerosis of aorta: Secondary | ICD-10-CM | POA: Diagnosis not present

## 2017-06-15 DIAGNOSIS — R05 Cough: Secondary | ICD-10-CM | POA: Diagnosis not present

## 2017-06-15 DIAGNOSIS — E039 Hypothyroidism, unspecified: Secondary | ICD-10-CM | POA: Diagnosis not present

## 2017-06-15 DIAGNOSIS — M810 Age-related osteoporosis without current pathological fracture: Secondary | ICD-10-CM | POA: Diagnosis not present

## 2017-06-15 DIAGNOSIS — J42 Unspecified chronic bronchitis: Secondary | ICD-10-CM | POA: Diagnosis not present

## 2017-06-15 DIAGNOSIS — Z86718 Personal history of other venous thrombosis and embolism: Secondary | ICD-10-CM | POA: Diagnosis not present

## 2017-06-17 DIAGNOSIS — E860 Dehydration: Secondary | ICD-10-CM | POA: Diagnosis not present

## 2017-06-17 DIAGNOSIS — Z6827 Body mass index (BMI) 27.0-27.9, adult: Secondary | ICD-10-CM | POA: Diagnosis not present

## 2017-06-17 DIAGNOSIS — J111 Influenza due to unidentified influenza virus with other respiratory manifestations: Secondary | ICD-10-CM | POA: Diagnosis not present

## 2017-06-17 DIAGNOSIS — N183 Chronic kidney disease, stage 3 (moderate): Secondary | ICD-10-CM | POA: Diagnosis not present

## 2017-06-17 DIAGNOSIS — E039 Hypothyroidism, unspecified: Secondary | ICD-10-CM | POA: Diagnosis not present

## 2017-06-22 DIAGNOSIS — I509 Heart failure, unspecified: Secondary | ICD-10-CM | POA: Diagnosis not present

## 2017-06-22 DIAGNOSIS — J1001 Influenza due to other identified influenza virus with the same other identified influenza virus pneumonia: Secondary | ICD-10-CM | POA: Diagnosis not present

## 2017-06-22 DIAGNOSIS — Z6828 Body mass index (BMI) 28.0-28.9, adult: Secondary | ICD-10-CM | POA: Diagnosis not present

## 2017-06-22 DIAGNOSIS — D72829 Elevated white blood cell count, unspecified: Secondary | ICD-10-CM | POA: Diagnosis not present

## 2017-06-22 DIAGNOSIS — N184 Chronic kidney disease, stage 4 (severe): Secondary | ICD-10-CM | POA: Diagnosis not present

## 2017-06-22 DIAGNOSIS — E860 Dehydration: Secondary | ICD-10-CM | POA: Diagnosis not present

## 2017-07-07 DIAGNOSIS — Z79899 Other long term (current) drug therapy: Secondary | ICD-10-CM | POA: Diagnosis not present

## 2017-07-07 DIAGNOSIS — I081 Rheumatic disorders of both mitral and tricuspid valves: Secondary | ICD-10-CM | POA: Diagnosis not present

## 2017-07-07 DIAGNOSIS — I951 Orthostatic hypotension: Secondary | ICD-10-CM | POA: Diagnosis not present

## 2017-07-07 DIAGNOSIS — Z952 Presence of prosthetic heart valve: Secondary | ICD-10-CM | POA: Diagnosis not present

## 2017-08-03 DIAGNOSIS — N135 Crossing vessel and stricture of ureter without hydronephrosis: Secondary | ICD-10-CM | POA: Diagnosis not present

## 2017-08-03 DIAGNOSIS — N200 Calculus of kidney: Secondary | ICD-10-CM | POA: Diagnosis not present

## 2017-08-03 DIAGNOSIS — N131 Hydronephrosis with ureteral stricture, not elsewhere classified: Secondary | ICD-10-CM | POA: Diagnosis not present

## 2017-08-03 DIAGNOSIS — N183 Chronic kidney disease, stage 3 (moderate): Secondary | ICD-10-CM | POA: Diagnosis not present

## 2017-08-03 DIAGNOSIS — N281 Cyst of kidney, acquired: Secondary | ICD-10-CM | POA: Diagnosis not present

## 2017-09-07 DIAGNOSIS — Z1231 Encounter for screening mammogram for malignant neoplasm of breast: Secondary | ICD-10-CM | POA: Diagnosis not present

## 2017-10-28 DIAGNOSIS — M810 Age-related osteoporosis without current pathological fracture: Secondary | ICD-10-CM | POA: Diagnosis not present

## 2017-10-28 DIAGNOSIS — H903 Sensorineural hearing loss, bilateral: Secondary | ICD-10-CM | POA: Diagnosis not present

## 2017-11-17 DIAGNOSIS — E039 Hypothyroidism, unspecified: Secondary | ICD-10-CM | POA: Diagnosis not present

## 2017-11-17 DIAGNOSIS — E7800 Pure hypercholesterolemia, unspecified: Secondary | ICD-10-CM | POA: Diagnosis not present

## 2017-11-17 DIAGNOSIS — N184 Chronic kidney disease, stage 4 (severe): Secondary | ICD-10-CM | POA: Diagnosis not present

## 2017-11-23 DIAGNOSIS — F419 Anxiety disorder, unspecified: Secondary | ICD-10-CM | POA: Diagnosis not present

## 2017-11-23 DIAGNOSIS — M810 Age-related osteoporosis without current pathological fracture: Secondary | ICD-10-CM | POA: Diagnosis not present

## 2017-11-23 DIAGNOSIS — I2510 Atherosclerotic heart disease of native coronary artery without angina pectoris: Secondary | ICD-10-CM | POA: Diagnosis not present

## 2017-11-23 DIAGNOSIS — E039 Hypothyroidism, unspecified: Secondary | ICD-10-CM | POA: Diagnosis not present

## 2017-12-07 DIAGNOSIS — M810 Age-related osteoporosis without current pathological fracture: Secondary | ICD-10-CM | POA: Diagnosis not present

## 2017-12-16 DIAGNOSIS — E785 Hyperlipidemia, unspecified: Secondary | ICD-10-CM | POA: Diagnosis not present

## 2017-12-16 DIAGNOSIS — Z136 Encounter for screening for cardiovascular disorders: Secondary | ICD-10-CM | POA: Diagnosis not present

## 2017-12-16 DIAGNOSIS — Z139 Encounter for screening, unspecified: Secondary | ICD-10-CM | POA: Diagnosis not present

## 2017-12-16 DIAGNOSIS — Z1339 Encounter for screening examination for other mental health and behavioral disorders: Secondary | ICD-10-CM | POA: Diagnosis not present

## 2017-12-16 DIAGNOSIS — Z1331 Encounter for screening for depression: Secondary | ICD-10-CM | POA: Diagnosis not present

## 2017-12-16 DIAGNOSIS — Z0000 Encounter for general adult medical examination without abnormal findings: Secondary | ICD-10-CM | POA: Diagnosis not present

## 2017-12-16 DIAGNOSIS — Z9181 History of falling: Secondary | ICD-10-CM | POA: Diagnosis not present

## 2018-01-17 DIAGNOSIS — H35372 Puckering of macula, left eye: Secondary | ICD-10-CM | POA: Diagnosis not present

## 2018-01-17 DIAGNOSIS — Z961 Presence of intraocular lens: Secondary | ICD-10-CM | POA: Diagnosis not present

## 2018-01-17 DIAGNOSIS — H353132 Nonexudative age-related macular degeneration, bilateral, intermediate dry stage: Secondary | ICD-10-CM | POA: Diagnosis not present

## 2018-01-17 DIAGNOSIS — H35361 Drusen (degenerative) of macula, right eye: Secondary | ICD-10-CM | POA: Diagnosis not present

## 2018-01-17 DIAGNOSIS — H43813 Vitreous degeneration, bilateral: Secondary | ICD-10-CM | POA: Diagnosis not present

## 2018-01-31 DIAGNOSIS — Z23 Encounter for immunization: Secondary | ICD-10-CM | POA: Diagnosis not present

## 2018-02-22 DIAGNOSIS — R809 Proteinuria, unspecified: Secondary | ICD-10-CM | POA: Diagnosis not present

## 2018-02-22 DIAGNOSIS — E875 Hyperkalemia: Secondary | ICD-10-CM | POA: Diagnosis not present

## 2018-02-22 DIAGNOSIS — E785 Hyperlipidemia, unspecified: Secondary | ICD-10-CM | POA: Diagnosis not present

## 2018-02-22 DIAGNOSIS — N2581 Secondary hyperparathyroidism of renal origin: Secondary | ICD-10-CM | POA: Diagnosis not present

## 2018-02-22 DIAGNOSIS — N183 Chronic kidney disease, stage 3 (moderate): Secondary | ICD-10-CM | POA: Diagnosis not present

## 2018-02-22 DIAGNOSIS — N189 Chronic kidney disease, unspecified: Secondary | ICD-10-CM | POA: Diagnosis not present

## 2018-02-22 DIAGNOSIS — I129 Hypertensive chronic kidney disease with stage 1 through stage 4 chronic kidney disease, or unspecified chronic kidney disease: Secondary | ICD-10-CM | POA: Diagnosis not present

## 2018-02-22 DIAGNOSIS — D631 Anemia in chronic kidney disease: Secondary | ICD-10-CM | POA: Diagnosis not present

## 2018-05-29 DIAGNOSIS — E039 Hypothyroidism, unspecified: Secondary | ICD-10-CM | POA: Diagnosis not present

## 2018-05-29 DIAGNOSIS — K219 Gastro-esophageal reflux disease without esophagitis: Secondary | ICD-10-CM | POA: Diagnosis not present

## 2018-05-29 DIAGNOSIS — R490 Dysphonia: Secondary | ICD-10-CM | POA: Diagnosis not present

## 2018-05-29 DIAGNOSIS — E7800 Pure hypercholesterolemia, unspecified: Secondary | ICD-10-CM | POA: Diagnosis not present

## 2018-05-29 DIAGNOSIS — E559 Vitamin D deficiency, unspecified: Secondary | ICD-10-CM | POA: Diagnosis not present

## 2018-05-29 DIAGNOSIS — J029 Acute pharyngitis, unspecified: Secondary | ICD-10-CM | POA: Diagnosis not present

## 2018-05-29 DIAGNOSIS — N184 Chronic kidney disease, stage 4 (severe): Secondary | ICD-10-CM | POA: Diagnosis not present

## 2018-05-31 DIAGNOSIS — F419 Anxiety disorder, unspecified: Secondary | ICD-10-CM | POA: Diagnosis not present

## 2018-05-31 DIAGNOSIS — E039 Hypothyroidism, unspecified: Secondary | ICD-10-CM | POA: Diagnosis not present

## 2018-05-31 DIAGNOSIS — I2510 Atherosclerotic heart disease of native coronary artery without angina pectoris: Secondary | ICD-10-CM | POA: Diagnosis not present

## 2018-05-31 DIAGNOSIS — M810 Age-related osteoporosis without current pathological fracture: Secondary | ICD-10-CM | POA: Diagnosis not present

## 2018-06-09 DIAGNOSIS — M810 Age-related osteoporosis without current pathological fracture: Secondary | ICD-10-CM | POA: Diagnosis not present

## 2018-09-26 DIAGNOSIS — H353132 Nonexudative age-related macular degeneration, bilateral, intermediate dry stage: Secondary | ICD-10-CM | POA: Diagnosis not present

## 2018-09-26 DIAGNOSIS — H43813 Vitreous degeneration, bilateral: Secondary | ICD-10-CM | POA: Diagnosis not present

## 2018-09-26 DIAGNOSIS — Z961 Presence of intraocular lens: Secondary | ICD-10-CM | POA: Diagnosis not present

## 2018-09-26 DIAGNOSIS — H35372 Puckering of macula, left eye: Secondary | ICD-10-CM | POA: Diagnosis not present

## 2018-10-11 DIAGNOSIS — N261 Atrophy of kidney (terminal): Secondary | ICD-10-CM | POA: Diagnosis not present

## 2018-10-11 DIAGNOSIS — N200 Calculus of kidney: Secondary | ICD-10-CM | POA: Diagnosis not present

## 2018-10-11 DIAGNOSIS — N281 Cyst of kidney, acquired: Secondary | ICD-10-CM | POA: Diagnosis not present

## 2018-10-20 DIAGNOSIS — N183 Chronic kidney disease, stage 3 (moderate): Secondary | ICD-10-CM | POA: Diagnosis not present

## 2018-11-08 DIAGNOSIS — Z1231 Encounter for screening mammogram for malignant neoplasm of breast: Secondary | ICD-10-CM | POA: Diagnosis not present

## 2018-11-15 DIAGNOSIS — N200 Calculus of kidney: Secondary | ICD-10-CM | POA: Diagnosis not present

## 2018-11-15 DIAGNOSIS — N135 Crossing vessel and stricture of ureter without hydronephrosis: Secondary | ICD-10-CM | POA: Diagnosis not present

## 2018-11-15 DIAGNOSIS — Q6211 Congenital occlusion of ureteropelvic junction: Secondary | ICD-10-CM | POA: Diagnosis not present

## 2018-11-28 DIAGNOSIS — E559 Vitamin D deficiency, unspecified: Secondary | ICD-10-CM | POA: Diagnosis not present

## 2018-11-28 DIAGNOSIS — E039 Hypothyroidism, unspecified: Secondary | ICD-10-CM | POA: Diagnosis not present

## 2018-11-28 DIAGNOSIS — N184 Chronic kidney disease, stage 4 (severe): Secondary | ICD-10-CM | POA: Diagnosis not present

## 2018-11-28 DIAGNOSIS — E7800 Pure hypercholesterolemia, unspecified: Secondary | ICD-10-CM | POA: Diagnosis not present

## 2018-11-29 DIAGNOSIS — I361 Nonrheumatic tricuspid (valve) insufficiency: Secondary | ICD-10-CM | POA: Diagnosis not present

## 2018-11-29 DIAGNOSIS — I340 Nonrheumatic mitral (valve) insufficiency: Secondary | ICD-10-CM | POA: Diagnosis not present

## 2018-11-29 DIAGNOSIS — I951 Orthostatic hypotension: Secondary | ICD-10-CM | POA: Diagnosis not present

## 2018-11-29 DIAGNOSIS — Z952 Presence of prosthetic heart valve: Secondary | ICD-10-CM | POA: Diagnosis not present

## 2018-12-08 DIAGNOSIS — F419 Anxiety disorder, unspecified: Secondary | ICD-10-CM | POA: Diagnosis not present

## 2018-12-08 DIAGNOSIS — Z6827 Body mass index (BMI) 27.0-27.9, adult: Secondary | ICD-10-CM | POA: Diagnosis not present

## 2018-12-08 DIAGNOSIS — E039 Hypothyroidism, unspecified: Secondary | ICD-10-CM | POA: Diagnosis not present

## 2018-12-08 DIAGNOSIS — M810 Age-related osteoporosis without current pathological fracture: Secondary | ICD-10-CM | POA: Diagnosis not present

## 2018-12-21 DIAGNOSIS — E785 Hyperlipidemia, unspecified: Secondary | ICD-10-CM | POA: Diagnosis not present

## 2018-12-21 DIAGNOSIS — Z0000 Encounter for general adult medical examination without abnormal findings: Secondary | ICD-10-CM | POA: Diagnosis not present

## 2018-12-21 DIAGNOSIS — Z139 Encounter for screening, unspecified: Secondary | ICD-10-CM | POA: Diagnosis not present

## 2018-12-21 DIAGNOSIS — Z1331 Encounter for screening for depression: Secondary | ICD-10-CM | POA: Diagnosis not present

## 2018-12-21 DIAGNOSIS — Z9181 History of falling: Secondary | ICD-10-CM | POA: Diagnosis not present

## 2019-01-20 DIAGNOSIS — Z23 Encounter for immunization: Secondary | ICD-10-CM | POA: Diagnosis not present

## 2019-01-26 DIAGNOSIS — H353132 Nonexudative age-related macular degeneration, bilateral, intermediate dry stage: Secondary | ICD-10-CM | POA: Diagnosis not present

## 2019-01-26 DIAGNOSIS — H35372 Puckering of macula, left eye: Secondary | ICD-10-CM | POA: Diagnosis not present

## 2019-01-26 DIAGNOSIS — Z961 Presence of intraocular lens: Secondary | ICD-10-CM | POA: Diagnosis not present

## 2019-01-26 DIAGNOSIS — H43813 Vitreous degeneration, bilateral: Secondary | ICD-10-CM | POA: Diagnosis not present

## 2019-03-01 DIAGNOSIS — H35372 Puckering of macula, left eye: Secondary | ICD-10-CM | POA: Diagnosis not present

## 2019-03-01 DIAGNOSIS — Z961 Presence of intraocular lens: Secondary | ICD-10-CM | POA: Diagnosis not present

## 2019-03-01 DIAGNOSIS — H43813 Vitreous degeneration, bilateral: Secondary | ICD-10-CM | POA: Diagnosis not present

## 2019-03-01 DIAGNOSIS — H353132 Nonexudative age-related macular degeneration, bilateral, intermediate dry stage: Secondary | ICD-10-CM | POA: Diagnosis not present

## 2019-06-08 DIAGNOSIS — E039 Hypothyroidism, unspecified: Secondary | ICD-10-CM | POA: Diagnosis not present

## 2019-06-08 DIAGNOSIS — E7800 Pure hypercholesterolemia, unspecified: Secondary | ICD-10-CM | POA: Diagnosis not present

## 2019-06-08 DIAGNOSIS — E559 Vitamin D deficiency, unspecified: Secondary | ICD-10-CM | POA: Diagnosis not present

## 2019-06-08 DIAGNOSIS — N184 Chronic kidney disease, stage 4 (severe): Secondary | ICD-10-CM | POA: Diagnosis not present

## 2019-06-14 DIAGNOSIS — E7800 Pure hypercholesterolemia, unspecified: Secondary | ICD-10-CM | POA: Diagnosis not present

## 2019-06-14 DIAGNOSIS — N184 Chronic kidney disease, stage 4 (severe): Secondary | ICD-10-CM | POA: Diagnosis not present

## 2019-06-14 DIAGNOSIS — E559 Vitamin D deficiency, unspecified: Secondary | ICD-10-CM | POA: Diagnosis not present

## 2019-06-14 DIAGNOSIS — M810 Age-related osteoporosis without current pathological fracture: Secondary | ICD-10-CM | POA: Diagnosis not present

## 2019-06-29 DIAGNOSIS — H35372 Puckering of macula, left eye: Secondary | ICD-10-CM | POA: Diagnosis not present

## 2019-06-29 DIAGNOSIS — Z961 Presence of intraocular lens: Secondary | ICD-10-CM | POA: Diagnosis not present

## 2019-06-29 DIAGNOSIS — H43813 Vitreous degeneration, bilateral: Secondary | ICD-10-CM | POA: Diagnosis not present

## 2019-06-29 DIAGNOSIS — H353132 Nonexudative age-related macular degeneration, bilateral, intermediate dry stage: Secondary | ICD-10-CM | POA: Diagnosis not present

## 2019-09-14 DIAGNOSIS — R809 Proteinuria, unspecified: Secondary | ICD-10-CM | POA: Diagnosis not present

## 2019-09-14 DIAGNOSIS — M810 Age-related osteoporosis without current pathological fracture: Secondary | ICD-10-CM | POA: Diagnosis not present

## 2019-09-14 DIAGNOSIS — Z7982 Long term (current) use of aspirin: Secondary | ICD-10-CM | POA: Diagnosis not present

## 2019-09-14 DIAGNOSIS — E039 Hypothyroidism, unspecified: Secondary | ICD-10-CM | POA: Diagnosis not present

## 2019-09-14 DIAGNOSIS — Z87448 Personal history of other diseases of urinary system: Secondary | ICD-10-CM | POA: Diagnosis not present

## 2019-09-14 DIAGNOSIS — I129 Hypertensive chronic kidney disease with stage 1 through stage 4 chronic kidney disease, or unspecified chronic kidney disease: Secondary | ICD-10-CM | POA: Diagnosis not present

## 2019-09-14 DIAGNOSIS — Z952 Presence of prosthetic heart valve: Secondary | ICD-10-CM | POA: Diagnosis not present

## 2019-09-14 DIAGNOSIS — Z79899 Other long term (current) drug therapy: Secondary | ICD-10-CM | POA: Diagnosis not present

## 2019-09-14 DIAGNOSIS — D649 Anemia, unspecified: Secondary | ICD-10-CM | POA: Diagnosis not present

## 2019-09-14 DIAGNOSIS — Z87440 Personal history of urinary (tract) infections: Secondary | ICD-10-CM | POA: Diagnosis not present

## 2019-09-14 DIAGNOSIS — N184 Chronic kidney disease, stage 4 (severe): Secondary | ICD-10-CM | POA: Diagnosis not present

## 2019-09-14 DIAGNOSIS — N183 Chronic kidney disease, stage 3 unspecified: Secondary | ICD-10-CM | POA: Diagnosis not present

## 2019-09-14 DIAGNOSIS — E785 Hyperlipidemia, unspecified: Secondary | ICD-10-CM | POA: Diagnosis not present

## 2019-09-14 DIAGNOSIS — N2581 Secondary hyperparathyroidism of renal origin: Secondary | ICD-10-CM | POA: Diagnosis not present

## 2019-09-14 DIAGNOSIS — E875 Hyperkalemia: Secondary | ICD-10-CM | POA: Diagnosis not present

## 2019-10-30 DIAGNOSIS — H35372 Puckering of macula, left eye: Secondary | ICD-10-CM | POA: Diagnosis not present

## 2019-10-30 DIAGNOSIS — Z961 Presence of intraocular lens: Secondary | ICD-10-CM | POA: Diagnosis not present

## 2019-10-30 DIAGNOSIS — H43813 Vitreous degeneration, bilateral: Secondary | ICD-10-CM | POA: Diagnosis not present

## 2019-10-30 DIAGNOSIS — H353132 Nonexudative age-related macular degeneration, bilateral, intermediate dry stage: Secondary | ICD-10-CM | POA: Diagnosis not present

## 2019-11-05 DIAGNOSIS — H53413 Scotoma involving central area, bilateral: Secondary | ICD-10-CM | POA: Diagnosis not present

## 2019-11-05 DIAGNOSIS — H539 Unspecified visual disturbance: Secondary | ICD-10-CM | POA: Diagnosis not present

## 2019-11-05 DIAGNOSIS — H3530 Unspecified macular degeneration: Secondary | ICD-10-CM | POA: Diagnosis not present

## 2019-11-05 DIAGNOSIS — Z736 Limitation of activities due to disability: Secondary | ICD-10-CM | POA: Diagnosis not present

## 2019-11-19 DIAGNOSIS — N1330 Unspecified hydronephrosis: Secondary | ICD-10-CM | POA: Diagnosis not present

## 2019-11-19 DIAGNOSIS — Q6211 Congenital occlusion of ureteropelvic junction: Secondary | ICD-10-CM | POA: Diagnosis not present

## 2019-11-19 DIAGNOSIS — N200 Calculus of kidney: Secondary | ICD-10-CM | POA: Diagnosis not present

## 2019-11-19 DIAGNOSIS — N2889 Other specified disorders of kidney and ureter: Secondary | ICD-10-CM | POA: Diagnosis not present

## 2019-11-19 DIAGNOSIS — N135 Crossing vessel and stricture of ureter without hydronephrosis: Secondary | ICD-10-CM | POA: Diagnosis not present

## 2019-11-19 DIAGNOSIS — Z9049 Acquired absence of other specified parts of digestive tract: Secondary | ICD-10-CM | POA: Diagnosis not present

## 2019-11-28 DIAGNOSIS — I517 Cardiomegaly: Secondary | ICD-10-CM | POA: Diagnosis not present

## 2019-11-28 DIAGNOSIS — Z952 Presence of prosthetic heart valve: Secondary | ICD-10-CM | POA: Diagnosis not present

## 2019-11-28 DIAGNOSIS — Z79899 Other long term (current) drug therapy: Secondary | ICD-10-CM | POA: Diagnosis not present

## 2019-11-28 DIAGNOSIS — I361 Nonrheumatic tricuspid (valve) insufficiency: Secondary | ICD-10-CM | POA: Diagnosis not present

## 2019-11-28 DIAGNOSIS — I951 Orthostatic hypotension: Secondary | ICD-10-CM | POA: Diagnosis not present

## 2019-11-28 DIAGNOSIS — I071 Rheumatic tricuspid insufficiency: Secondary | ICD-10-CM | POA: Diagnosis not present

## 2019-11-28 DIAGNOSIS — R079 Chest pain, unspecified: Secondary | ICD-10-CM | POA: Diagnosis not present

## 2019-11-28 DIAGNOSIS — Z48812 Encounter for surgical aftercare following surgery on the circulatory system: Secondary | ICD-10-CM | POA: Diagnosis not present

## 2019-12-13 DIAGNOSIS — M810 Age-related osteoporosis without current pathological fracture: Secondary | ICD-10-CM | POA: Diagnosis not present

## 2019-12-18 DIAGNOSIS — E875 Hyperkalemia: Secondary | ICD-10-CM | POA: Diagnosis not present

## 2019-12-18 DIAGNOSIS — I38 Endocarditis, valve unspecified: Secondary | ICD-10-CM | POA: Diagnosis not present

## 2019-12-18 DIAGNOSIS — R079 Chest pain, unspecified: Secondary | ICD-10-CM | POA: Diagnosis not present

## 2019-12-18 DIAGNOSIS — I509 Heart failure, unspecified: Secondary | ICD-10-CM | POA: Diagnosis not present

## 2019-12-18 DIAGNOSIS — N189 Chronic kidney disease, unspecified: Secondary | ICD-10-CM | POA: Diagnosis not present

## 2019-12-18 DIAGNOSIS — I130 Hypertensive heart and chronic kidney disease with heart failure and stage 1 through stage 4 chronic kidney disease, or unspecified chronic kidney disease: Secondary | ICD-10-CM | POA: Diagnosis not present

## 2019-12-18 DIAGNOSIS — Z9889 Other specified postprocedural states: Secondary | ICD-10-CM | POA: Diagnosis not present

## 2019-12-18 DIAGNOSIS — Z951 Presence of aortocoronary bypass graft: Secondary | ICD-10-CM | POA: Diagnosis not present

## 2019-12-18 DIAGNOSIS — I2510 Atherosclerotic heart disease of native coronary artery without angina pectoris: Secondary | ICD-10-CM | POA: Diagnosis not present

## 2019-12-18 DIAGNOSIS — I951 Orthostatic hypotension: Secondary | ICD-10-CM | POA: Diagnosis not present

## 2019-12-24 DIAGNOSIS — E785 Hyperlipidemia, unspecified: Secondary | ICD-10-CM | POA: Diagnosis not present

## 2019-12-24 DIAGNOSIS — Z1331 Encounter for screening for depression: Secondary | ICD-10-CM | POA: Diagnosis not present

## 2019-12-24 DIAGNOSIS — Z9181 History of falling: Secondary | ICD-10-CM | POA: Diagnosis not present

## 2019-12-24 DIAGNOSIS — Z0000 Encounter for general adult medical examination without abnormal findings: Secondary | ICD-10-CM | POA: Diagnosis not present

## 2019-12-28 DIAGNOSIS — M810 Age-related osteoporosis without current pathological fracture: Secondary | ICD-10-CM | POA: Diagnosis not present

## 2020-01-09 DIAGNOSIS — E559 Vitamin D deficiency, unspecified: Secondary | ICD-10-CM | POA: Diagnosis not present

## 2020-01-09 DIAGNOSIS — E7800 Pure hypercholesterolemia, unspecified: Secondary | ICD-10-CM | POA: Diagnosis not present

## 2020-01-09 DIAGNOSIS — E039 Hypothyroidism, unspecified: Secondary | ICD-10-CM | POA: Diagnosis not present

## 2020-01-11 DIAGNOSIS — Z23 Encounter for immunization: Secondary | ICD-10-CM | POA: Diagnosis not present

## 2020-01-11 DIAGNOSIS — F419 Anxiety disorder, unspecified: Secondary | ICD-10-CM | POA: Diagnosis not present

## 2020-01-11 DIAGNOSIS — E7800 Pure hypercholesterolemia, unspecified: Secondary | ICD-10-CM | POA: Diagnosis not present

## 2020-01-11 DIAGNOSIS — Z139 Encounter for screening, unspecified: Secondary | ICD-10-CM | POA: Diagnosis not present

## 2020-01-11 DIAGNOSIS — Z6826 Body mass index (BMI) 26.0-26.9, adult: Secondary | ICD-10-CM | POA: Diagnosis not present

## 2020-01-11 DIAGNOSIS — N1832 Chronic kidney disease, stage 3b: Secondary | ICD-10-CM | POA: Diagnosis not present

## 2020-01-11 DIAGNOSIS — E039 Hypothyroidism, unspecified: Secondary | ICD-10-CM | POA: Diagnosis not present

## 2020-01-11 DIAGNOSIS — I509 Heart failure, unspecified: Secondary | ICD-10-CM | POA: Diagnosis not present

## 2020-01-25 DIAGNOSIS — E875 Hyperkalemia: Secondary | ICD-10-CM | POA: Diagnosis not present

## 2020-02-29 DIAGNOSIS — Z23 Encounter for immunization: Secondary | ICD-10-CM | POA: Diagnosis not present

## 2020-03-11 DIAGNOSIS — Z961 Presence of intraocular lens: Secondary | ICD-10-CM | POA: Diagnosis not present

## 2020-03-11 DIAGNOSIS — H43813 Vitreous degeneration, bilateral: Secondary | ICD-10-CM | POA: Diagnosis not present

## 2020-03-11 DIAGNOSIS — H35372 Puckering of macula, left eye: Secondary | ICD-10-CM | POA: Diagnosis not present

## 2020-03-11 DIAGNOSIS — H353132 Nonexudative age-related macular degeneration, bilateral, intermediate dry stage: Secondary | ICD-10-CM | POA: Diagnosis not present

## 2020-03-28 DIAGNOSIS — N183 Chronic kidney disease, stage 3 unspecified: Secondary | ICD-10-CM | POA: Diagnosis not present

## 2020-06-27 DIAGNOSIS — E7800 Pure hypercholesterolemia, unspecified: Secondary | ICD-10-CM | POA: Diagnosis not present

## 2020-06-27 DIAGNOSIS — E039 Hypothyroidism, unspecified: Secondary | ICD-10-CM | POA: Diagnosis not present

## 2020-06-27 DIAGNOSIS — E559 Vitamin D deficiency, unspecified: Secondary | ICD-10-CM | POA: Diagnosis not present

## 2020-07-15 DIAGNOSIS — H35372 Puckering of macula, left eye: Secondary | ICD-10-CM | POA: Diagnosis not present

## 2020-07-15 DIAGNOSIS — Z961 Presence of intraocular lens: Secondary | ICD-10-CM | POA: Diagnosis not present

## 2020-07-15 DIAGNOSIS — H353132 Nonexudative age-related macular degeneration, bilateral, intermediate dry stage: Secondary | ICD-10-CM | POA: Diagnosis not present

## 2020-07-15 DIAGNOSIS — H43813 Vitreous degeneration, bilateral: Secondary | ICD-10-CM | POA: Diagnosis not present

## 2020-07-17 DIAGNOSIS — M810 Age-related osteoporosis without current pathological fracture: Secondary | ICD-10-CM | POA: Diagnosis not present

## 2020-07-17 DIAGNOSIS — J8410 Pulmonary fibrosis, unspecified: Secondary | ICD-10-CM | POA: Diagnosis not present

## 2020-07-17 DIAGNOSIS — I10 Essential (primary) hypertension: Secondary | ICD-10-CM | POA: Diagnosis not present

## 2020-07-17 DIAGNOSIS — Z1231 Encounter for screening mammogram for malignant neoplasm of breast: Secondary | ICD-10-CM | POA: Diagnosis not present

## 2020-07-17 DIAGNOSIS — M8589 Other specified disorders of bone density and structure, multiple sites: Secondary | ICD-10-CM | POA: Diagnosis not present

## 2020-07-17 DIAGNOSIS — E7800 Pure hypercholesterolemia, unspecified: Secondary | ICD-10-CM | POA: Diagnosis not present

## 2020-07-17 DIAGNOSIS — Z6826 Body mass index (BMI) 26.0-26.9, adult: Secondary | ICD-10-CM | POA: Diagnosis not present

## 2020-07-17 DIAGNOSIS — N1832 Chronic kidney disease, stage 3b: Secondary | ICD-10-CM | POA: Diagnosis not present

## 2020-07-17 DIAGNOSIS — E041 Nontoxic single thyroid nodule: Secondary | ICD-10-CM | POA: Diagnosis not present

## 2020-07-17 DIAGNOSIS — E039 Hypothyroidism, unspecified: Secondary | ICD-10-CM | POA: Diagnosis not present

## 2020-07-17 DIAGNOSIS — I509 Heart failure, unspecified: Secondary | ICD-10-CM | POA: Diagnosis not present

## 2020-07-23 DIAGNOSIS — Z8679 Personal history of other diseases of the circulatory system: Secondary | ICD-10-CM | POA: Diagnosis not present

## 2020-07-23 DIAGNOSIS — I348 Other nonrheumatic mitral valve disorders: Secondary | ICD-10-CM | POA: Diagnosis not present

## 2020-07-23 DIAGNOSIS — R0609 Other forms of dyspnea: Secondary | ICD-10-CM | POA: Diagnosis not present

## 2020-07-23 DIAGNOSIS — Z952 Presence of prosthetic heart valve: Secondary | ICD-10-CM | POA: Diagnosis not present

## 2020-07-23 DIAGNOSIS — R0602 Shortness of breath: Secondary | ICD-10-CM | POA: Diagnosis not present

## 2020-07-23 DIAGNOSIS — I071 Rheumatic tricuspid insufficiency: Secondary | ICD-10-CM | POA: Diagnosis not present

## 2020-07-23 DIAGNOSIS — I10 Essential (primary) hypertension: Secondary | ICD-10-CM | POA: Diagnosis not present

## 2020-07-23 DIAGNOSIS — I361 Nonrheumatic tricuspid (valve) insufficiency: Secondary | ICD-10-CM | POA: Diagnosis not present

## 2020-08-06 DIAGNOSIS — Z79899 Other long term (current) drug therapy: Secondary | ICD-10-CM | POA: Diagnosis not present

## 2020-08-19 DIAGNOSIS — M810 Age-related osteoporosis without current pathological fracture: Secondary | ICD-10-CM | POA: Diagnosis not present

## 2020-08-19 DIAGNOSIS — Z1231 Encounter for screening mammogram for malignant neoplasm of breast: Secondary | ICD-10-CM | POA: Diagnosis not present

## 2020-08-27 DIAGNOSIS — R928 Other abnormal and inconclusive findings on diagnostic imaging of breast: Secondary | ICD-10-CM | POA: Diagnosis not present

## 2020-08-27 DIAGNOSIS — R922 Inconclusive mammogram: Secondary | ICD-10-CM | POA: Diagnosis not present

## 2020-08-28 DIAGNOSIS — R0602 Shortness of breath: Secondary | ICD-10-CM | POA: Diagnosis not present

## 2020-10-01 DIAGNOSIS — Z79899 Other long term (current) drug therapy: Secondary | ICD-10-CM | POA: Diagnosis not present

## 2020-10-01 DIAGNOSIS — N184 Chronic kidney disease, stage 4 (severe): Secondary | ICD-10-CM | POA: Diagnosis not present

## 2020-10-01 DIAGNOSIS — D649 Anemia, unspecified: Secondary | ICD-10-CM | POA: Diagnosis not present

## 2020-10-01 DIAGNOSIS — E785 Hyperlipidemia, unspecified: Secondary | ICD-10-CM | POA: Diagnosis not present

## 2020-10-01 DIAGNOSIS — N2581 Secondary hyperparathyroidism of renal origin: Secondary | ICD-10-CM | POA: Diagnosis not present

## 2020-10-01 DIAGNOSIS — E875 Hyperkalemia: Secondary | ICD-10-CM | POA: Diagnosis not present

## 2020-10-01 DIAGNOSIS — I129 Hypertensive chronic kidney disease with stage 1 through stage 4 chronic kidney disease, or unspecified chronic kidney disease: Secondary | ICD-10-CM | POA: Diagnosis not present

## 2020-10-01 DIAGNOSIS — N183 Chronic kidney disease, stage 3 unspecified: Secondary | ICD-10-CM | POA: Diagnosis not present

## 2020-10-01 DIAGNOSIS — R808 Other proteinuria: Secondary | ICD-10-CM | POA: Diagnosis not present

## 2020-11-03 DIAGNOSIS — E7800 Pure hypercholesterolemia, unspecified: Secondary | ICD-10-CM | POA: Diagnosis not present

## 2020-11-04 DIAGNOSIS — H02834 Dermatochalasis of left upper eyelid: Secondary | ICD-10-CM | POA: Diagnosis not present

## 2020-11-04 DIAGNOSIS — H3530 Unspecified macular degeneration: Secondary | ICD-10-CM | POA: Diagnosis not present

## 2020-11-04 DIAGNOSIS — H53413 Scotoma involving central area, bilateral: Secondary | ICD-10-CM | POA: Diagnosis not present

## 2020-11-04 DIAGNOSIS — Z736 Limitation of activities due to disability: Secondary | ICD-10-CM | POA: Diagnosis not present

## 2020-11-04 DIAGNOSIS — H539 Unspecified visual disturbance: Secondary | ICD-10-CM | POA: Diagnosis not present

## 2020-11-12 DIAGNOSIS — Z952 Presence of prosthetic heart valve: Secondary | ICD-10-CM | POA: Diagnosis not present

## 2020-11-12 DIAGNOSIS — Z79899 Other long term (current) drug therapy: Secondary | ICD-10-CM | POA: Diagnosis not present

## 2020-11-12 DIAGNOSIS — I951 Orthostatic hypotension: Secondary | ICD-10-CM | POA: Diagnosis not present

## 2020-12-25 DIAGNOSIS — Z0000 Encounter for general adult medical examination without abnormal findings: Secondary | ICD-10-CM | POA: Diagnosis not present

## 2020-12-25 DIAGNOSIS — E785 Hyperlipidemia, unspecified: Secondary | ICD-10-CM | POA: Diagnosis not present

## 2020-12-25 DIAGNOSIS — Z1331 Encounter for screening for depression: Secondary | ICD-10-CM | POA: Diagnosis not present

## 2020-12-25 DIAGNOSIS — Z9181 History of falling: Secondary | ICD-10-CM | POA: Diagnosis not present

## 2020-12-26 DIAGNOSIS — H43813 Vitreous degeneration, bilateral: Secondary | ICD-10-CM | POA: Diagnosis not present

## 2020-12-26 DIAGNOSIS — H35372 Puckering of macula, left eye: Secondary | ICD-10-CM | POA: Diagnosis not present

## 2020-12-26 DIAGNOSIS — H353132 Nonexudative age-related macular degeneration, bilateral, intermediate dry stage: Secondary | ICD-10-CM | POA: Diagnosis not present

## 2020-12-26 DIAGNOSIS — Z961 Presence of intraocular lens: Secondary | ICD-10-CM | POA: Diagnosis not present

## 2021-01-19 DIAGNOSIS — E7800 Pure hypercholesterolemia, unspecified: Secondary | ICD-10-CM | POA: Diagnosis not present

## 2021-01-19 DIAGNOSIS — E039 Hypothyroidism, unspecified: Secondary | ICD-10-CM | POA: Diagnosis not present

## 2021-01-19 DIAGNOSIS — N1832 Chronic kidney disease, stage 3b: Secondary | ICD-10-CM | POA: Diagnosis not present

## 2021-02-02 DIAGNOSIS — M810 Age-related osteoporosis without current pathological fracture: Secondary | ICD-10-CM | POA: Diagnosis not present

## 2021-02-02 DIAGNOSIS — N184 Chronic kidney disease, stage 4 (severe): Secondary | ICD-10-CM | POA: Diagnosis not present

## 2021-02-02 DIAGNOSIS — Z23 Encounter for immunization: Secondary | ICD-10-CM | POA: Diagnosis not present

## 2021-02-02 DIAGNOSIS — Z139 Encounter for screening, unspecified: Secondary | ICD-10-CM | POA: Diagnosis not present

## 2021-02-02 DIAGNOSIS — E039 Hypothyroidism, unspecified: Secondary | ICD-10-CM | POA: Diagnosis not present

## 2021-02-02 DIAGNOSIS — Z6826 Body mass index (BMI) 26.0-26.9, adult: Secondary | ICD-10-CM | POA: Diagnosis not present

## 2021-02-02 DIAGNOSIS — E7800 Pure hypercholesterolemia, unspecified: Secondary | ICD-10-CM | POA: Diagnosis not present

## 2021-02-06 DIAGNOSIS — M810 Age-related osteoporosis without current pathological fracture: Secondary | ICD-10-CM | POA: Diagnosis not present

## 2021-02-16 DIAGNOSIS — N184 Chronic kidney disease, stage 4 (severe): Secondary | ICD-10-CM | POA: Diagnosis not present

## 2021-03-20 DIAGNOSIS — U071 COVID-19: Secondary | ICD-10-CM | POA: Diagnosis not present

## 2021-03-20 DIAGNOSIS — Z6825 Body mass index (BMI) 25.0-25.9, adult: Secondary | ICD-10-CM | POA: Diagnosis not present

## 2021-03-27 DIAGNOSIS — Z6826 Body mass index (BMI) 26.0-26.9, adult: Secondary | ICD-10-CM | POA: Diagnosis not present

## 2021-03-27 DIAGNOSIS — F419 Anxiety disorder, unspecified: Secondary | ICD-10-CM | POA: Diagnosis not present

## 2021-03-27 DIAGNOSIS — E039 Hypothyroidism, unspecified: Secondary | ICD-10-CM | POA: Diagnosis not present

## 2021-03-27 DIAGNOSIS — U071 COVID-19: Secondary | ICD-10-CM | POA: Diagnosis not present

## 2021-04-07 DIAGNOSIS — U071 COVID-19: Secondary | ICD-10-CM | POA: Diagnosis not present

## 2021-04-20 ENCOUNTER — Observation Stay (HOSPITAL_COMMUNITY): Payer: Medicare Other

## 2021-04-20 ENCOUNTER — Other Ambulatory Visit: Payer: Self-pay

## 2021-04-20 ENCOUNTER — Encounter (HOSPITAL_COMMUNITY): Payer: Self-pay | Admitting: Emergency Medicine

## 2021-04-20 ENCOUNTER — Emergency Department (HOSPITAL_COMMUNITY): Payer: Medicare Other

## 2021-04-20 ENCOUNTER — Inpatient Hospital Stay (HOSPITAL_COMMUNITY)
Admission: EM | Admit: 2021-04-20 | Discharge: 2021-04-24 | DRG: 194 | Disposition: A | Discharge status: Home Health | Payer: Medicare Other | Attending: Internal Medicine | Admitting: Internal Medicine

## 2021-04-20 DIAGNOSIS — N1832 Chronic kidney disease, stage 3b: Secondary | ICD-10-CM | POA: Diagnosis not present

## 2021-04-20 DIAGNOSIS — I2510 Atherosclerotic heart disease of native coronary artery without angina pectoris: Secondary | ICD-10-CM | POA: Diagnosis present

## 2021-04-20 DIAGNOSIS — I129 Hypertensive chronic kidney disease with stage 1 through stage 4 chronic kidney disease, or unspecified chronic kidney disease: Secondary | ICD-10-CM | POA: Diagnosis present

## 2021-04-20 DIAGNOSIS — F419 Anxiety disorder, unspecified: Secondary | ICD-10-CM | POA: Diagnosis present

## 2021-04-20 DIAGNOSIS — R0902 Hypoxemia: Secondary | ICD-10-CM | POA: Diagnosis not present

## 2021-04-20 DIAGNOSIS — R531 Weakness: Secondary | ICD-10-CM | POA: Diagnosis not present

## 2021-04-20 DIAGNOSIS — E039 Hypothyroidism, unspecified: Secondary | ICD-10-CM | POA: Diagnosis present

## 2021-04-20 DIAGNOSIS — Z953 Presence of xenogenic heart valve: Secondary | ICD-10-CM | POA: Diagnosis not present

## 2021-04-20 DIAGNOSIS — N179 Acute kidney failure, unspecified: Secondary | ICD-10-CM | POA: Diagnosis present

## 2021-04-20 DIAGNOSIS — U099 Post covid-19 condition, unspecified: Secondary | ICD-10-CM | POA: Diagnosis present

## 2021-04-20 DIAGNOSIS — R109 Unspecified abdominal pain: Secondary | ICD-10-CM | POA: Diagnosis not present

## 2021-04-20 DIAGNOSIS — M810 Age-related osteoporosis without current pathological fracture: Secondary | ICD-10-CM | POA: Diagnosis present

## 2021-04-20 DIAGNOSIS — F32A Depression, unspecified: Secondary | ICD-10-CM | POA: Diagnosis present

## 2021-04-20 DIAGNOSIS — R918 Other nonspecific abnormal finding of lung field: Secondary | ICD-10-CM

## 2021-04-20 DIAGNOSIS — E7800 Pure hypercholesterolemia, unspecified: Secondary | ICD-10-CM | POA: Diagnosis present

## 2021-04-20 DIAGNOSIS — Z952 Presence of prosthetic heart valve: Secondary | ICD-10-CM

## 2021-04-20 DIAGNOSIS — Z8049 Family history of malignant neoplasm of other genital organs: Secondary | ICD-10-CM | POA: Diagnosis not present

## 2021-04-20 DIAGNOSIS — E860 Dehydration: Secondary | ICD-10-CM | POA: Diagnosis present

## 2021-04-20 DIAGNOSIS — R5381 Other malaise: Secondary | ICD-10-CM | POA: Diagnosis present

## 2021-04-20 DIAGNOSIS — E875 Hyperkalemia: Secondary | ICD-10-CM | POA: Diagnosis present

## 2021-04-20 DIAGNOSIS — R0602 Shortness of breath: Secondary | ICD-10-CM | POA: Diagnosis not present

## 2021-04-20 DIAGNOSIS — Z951 Presence of aortocoronary bypass graft: Secondary | ICD-10-CM

## 2021-04-20 DIAGNOSIS — F418 Other specified anxiety disorders: Secondary | ICD-10-CM | POA: Diagnosis present

## 2021-04-20 DIAGNOSIS — Z743 Need for continuous supervision: Secondary | ICD-10-CM | POA: Diagnosis not present

## 2021-04-20 DIAGNOSIS — I10 Essential (primary) hypertension: Secondary | ICD-10-CM | POA: Diagnosis present

## 2021-04-20 DIAGNOSIS — R079 Chest pain, unspecified: Secondary | ICD-10-CM | POA: Diagnosis not present

## 2021-04-20 DIAGNOSIS — N189 Chronic kidney disease, unspecified: Secondary | ICD-10-CM | POA: Diagnosis not present

## 2021-04-20 DIAGNOSIS — E872 Acidosis, unspecified: Secondary | ICD-10-CM | POA: Diagnosis present

## 2021-04-20 DIAGNOSIS — J439 Emphysema, unspecified: Secondary | ICD-10-CM | POA: Diagnosis not present

## 2021-04-20 DIAGNOSIS — J189 Pneumonia, unspecified organism: Secondary | ICD-10-CM | POA: Diagnosis present

## 2021-04-20 DIAGNOSIS — Z8249 Family history of ischemic heart disease and other diseases of the circulatory system: Secondary | ICD-10-CM

## 2021-04-20 DIAGNOSIS — U071 COVID-19: Secondary | ICD-10-CM | POA: Diagnosis not present

## 2021-04-20 DIAGNOSIS — J1282 Pneumonia due to coronavirus disease 2019: Secondary | ICD-10-CM | POA: Diagnosis not present

## 2021-04-20 DIAGNOSIS — Z79890 Hormone replacement therapy (postmenopausal): Secondary | ICD-10-CM | POA: Diagnosis not present

## 2021-04-20 DIAGNOSIS — Z79899 Other long term (current) drug therapy: Secondary | ICD-10-CM

## 2021-04-20 DIAGNOSIS — E785 Hyperlipidemia, unspecified: Secondary | ICD-10-CM | POA: Diagnosis present

## 2021-04-20 DIAGNOSIS — I959 Hypotension, unspecified: Secondary | ICD-10-CM | POA: Diagnosis not present

## 2021-04-20 DIAGNOSIS — Z8616 Personal history of COVID-19: Secondary | ICD-10-CM

## 2021-04-20 DIAGNOSIS — N1330 Unspecified hydronephrosis: Secondary | ICD-10-CM | POA: Diagnosis not present

## 2021-04-20 DIAGNOSIS — I700 Atherosclerosis of aorta: Secondary | ICD-10-CM | POA: Diagnosis not present

## 2021-04-20 DIAGNOSIS — K449 Diaphragmatic hernia without obstruction or gangrene: Secondary | ICD-10-CM | POA: Diagnosis not present

## 2021-04-20 LAB — CBC WITH DIFFERENTIAL/PLATELET
Abs Immature Granulocytes: 0.05 10*3/uL (ref 0.00–0.07)
Basophils Absolute: 0.1 10*3/uL (ref 0.0–0.1)
Basophils Relative: 1 %
Eosinophils Absolute: 0.2 10*3/uL (ref 0.0–0.5)
Eosinophils Relative: 2 %
HCT: 33.2 % — ABNORMAL LOW (ref 36.0–46.0)
Hemoglobin: 10.2 g/dL — ABNORMAL LOW (ref 12.0–15.0)
Immature Granulocytes: 1 %
Lymphocytes Relative: 19 %
Lymphs Abs: 1.3 10*3/uL (ref 0.7–4.0)
MCH: 28.8 pg (ref 26.0–34.0)
MCHC: 30.7 g/dL (ref 30.0–36.0)
MCV: 93.8 fL (ref 80.0–100.0)
Monocytes Absolute: 0.5 10*3/uL (ref 0.1–1.0)
Monocytes Relative: 6 %
Neutro Abs: 5.2 10*3/uL (ref 1.7–7.7)
Neutrophils Relative %: 71 %
Platelets: 489 10*3/uL — ABNORMAL HIGH (ref 150–400)
RBC: 3.54 MIL/uL — ABNORMAL LOW (ref 3.87–5.11)
RDW: 13.4 % (ref 11.5–15.5)
WBC: 7.2 10*3/uL (ref 4.0–10.5)
nRBC: 0 % (ref 0.0–0.2)

## 2021-04-20 LAB — TROPONIN I (HIGH SENSITIVITY)
Troponin I (High Sensitivity): 3 ng/L (ref <18)
Troponin I (High Sensitivity): 4 ng/L (ref <18)

## 2021-04-20 LAB — COMPREHENSIVE METABOLIC PANEL
ALT: 15 U/L (ref 0–44)
AST: 16 U/L (ref 15–41)
Albumin: 2.8 g/dL — ABNORMAL LOW (ref 3.5–5.0)
Alkaline Phosphatase: 59 U/L (ref 38–126)
Anion gap: 4 — ABNORMAL LOW (ref 5–15)
BUN: 23 mg/dL (ref 8–23)
CO2: 22 mmol/L (ref 22–32)
Calcium: 8.9 mg/dL (ref 8.9–10.3)
Chloride: 108 mmol/L (ref 98–111)
Creatinine, Ser: 1.42 mg/dL — ABNORMAL HIGH (ref 0.44–1.00)
GFR, Estimated: 36 mL/min — ABNORMAL LOW (ref >60)
Glucose, Bld: 90 mg/dL (ref 70–99)
Potassium: 5.2 mmol/L — ABNORMAL HIGH (ref 3.5–5.1)
Sodium: 134 mmol/L — ABNORMAL LOW (ref 135–145)
Total Bilirubin: 0.4 mg/dL (ref 0.3–1.2)
Total Protein: 7.2 g/dL (ref 6.5–8.1)

## 2021-04-20 LAB — RESP PANEL BY RT-PCR (FLU A&B, COVID) ARPGX2
Influenza A by PCR: NEGATIVE
Influenza B by PCR: NEGATIVE
SARS Coronavirus 2 by RT PCR: POSITIVE — AB

## 2021-04-20 LAB — BRAIN NATRIURETIC PEPTIDE: B Natriuretic Peptide: 59.5 pg/mL (ref 0.0–100.0)

## 2021-04-20 MED ORDER — ONDANSETRON HCL 4 MG/2ML IJ SOLN: 4.0000 mg | Freq: Four times a day (QID) | INTRAMUSCULAR | Status: DC | PRN

## 2021-04-20 MED ORDER — LEVOTHYROXINE SODIUM 75 MCG PO TABS
75.0000 ug | ORAL_TABLET | Freq: Every day | ORAL | Status: DC
  Administered 2021-04-21 – 2021-04-24 (×4): 75 ug via ORAL
  Filled 2021-04-20 (×4): qty 1

## 2021-04-20 MED ORDER — BENZONATATE 100 MG PO CAPS: 200.0000 mg | ORAL_CAPSULE | Freq: Three times a day (TID) | ORAL | Status: DC | PRN

## 2021-04-20 MED ORDER — IOHEXOL 350 MG/ML SOLN
75.0000 mL | Freq: Once | INTRAVENOUS | Status: AC | PRN
  Administered 2021-04-20: 60 mL via INTRAVENOUS

## 2021-04-20 MED ORDER — SODIUM CHLORIDE 0.9 % IV SOLN
500.0000 mg | Freq: Once | INTRAVENOUS | Status: AC
  Administered 2021-04-20: 500 mg via INTRAVENOUS
  Filled 2021-04-20: qty 5

## 2021-04-20 MED ORDER — ASPIRIN EC 81 MG PO TBEC
81.0000 mg | DELAYED_RELEASE_TABLET | Freq: Every day | ORAL | Status: DC
  Administered 2021-04-21 – 2021-04-24 (×4): 81 mg via ORAL
  Filled 2021-04-20 (×4): qty 1

## 2021-04-20 MED ORDER — ESCITALOPRAM OXALATE 10 MG PO TABS
5.0000 mg | ORAL_TABLET | Freq: Every day | ORAL | Status: DC
  Administered 2021-04-20 – 2021-04-23 (×4): 5 mg via ORAL
  Filled 2021-04-20 (×4): qty 1

## 2021-04-20 MED ORDER — HEPARIN SODIUM (PORCINE) 5000 UNIT/ML IJ SOLN
5000.0000 [IU] | Freq: Three times a day (TID) | INTRAMUSCULAR | Status: DC
  Administered 2021-04-20 – 2021-04-24 (×11): 5000 [IU] via SUBCUTANEOUS
  Filled 2021-04-20 (×11): qty 1

## 2021-04-20 MED ORDER — ONDANSETRON HCL 4 MG PO TABS: 4.0000 mg | ORAL_TABLET | Freq: Four times a day (QID) | ORAL | Status: DC | PRN

## 2021-04-20 MED ORDER — SODIUM CHLORIDE 0.9 % IV SOLN: INTRAVENOUS | Status: AC

## 2021-04-20 MED ORDER — MELATONIN 3 MG PO TABS
3.0000 mg | ORAL_TABLET | Freq: Once | ORAL | Status: AC
  Administered 2021-04-20: 3 mg via ORAL
  Filled 2021-04-20: qty 1

## 2021-04-20 MED ORDER — FAMOTIDINE 20 MG PO TABS: 20.0000 mg | ORAL_TABLET | Freq: Every evening | ORAL | Status: DC | PRN

## 2021-04-20 MED ORDER — SODIUM CHLORIDE 0.9 % IV SOLN
1.0000 g | INTRAVENOUS | Status: DC
  Administered 2021-04-20: 1 g via INTRAVENOUS
  Filled 2021-04-20 (×2): qty 10

## 2021-04-20 MED ORDER — ACETAMINOPHEN 325 MG PO TABS
650.0000 mg | ORAL_TABLET | Freq: Four times a day (QID) | ORAL | Status: DC | PRN
  Administered 2021-04-20: 650 mg via ORAL
  Filled 2021-04-20 (×2): qty 2

## 2021-04-20 MED ORDER — SODIUM ZIRCONIUM CYCLOSILICATE 5 G PO PACK
5.0000 g | PACK | Freq: Once | ORAL | Status: AC
  Administered 2021-04-21: 5 g via ORAL
  Filled 2021-04-20: qty 1

## 2021-04-20 MED ORDER — SODIUM CHLORIDE 0.9 % IV SOLN
500.0000 mg | INTRAVENOUS | Status: DC
  Administered 2021-04-21: 500 mg via INTRAVENOUS
  Filled 2021-04-20: qty 5

## 2021-04-20 MED ORDER — SENNOSIDES-DOCUSATE SODIUM 8.6-50 MG PO TABS: 1.0000 | ORAL_TABLET | Freq: Every evening | ORAL | Status: DC | PRN

## 2021-04-20 MED ORDER — ALBUTEROL SULFATE HFA 108 (90 BASE) MCG/ACT IN AERS
2.0000 | INHALATION_SPRAY | Freq: Four times a day (QID) | RESPIRATORY_TRACT | Status: DC
  Administered 2021-04-20 – 2021-04-22 (×8): 2 via RESPIRATORY_TRACT
  Filled 2021-04-20: qty 6.7

## 2021-04-20 MED ORDER — AMLODIPINE BESYLATE 5 MG PO TABS
2.5000 mg | ORAL_TABLET | Freq: Every day | ORAL | Status: DC
  Administered 2021-04-21 – 2021-04-24 (×4): 2.5 mg via ORAL
  Filled 2021-04-20 (×4): qty 1

## 2021-04-20 MED ORDER — DEXAMETHASONE 4 MG PO TABS
6.0000 mg | ORAL_TABLET | ORAL | Status: DC
  Administered 2021-04-20 – 2021-04-22 (×3): 6 mg via ORAL
  Filled 2021-04-20 (×3): qty 2

## 2021-04-20 MED ORDER — EZETIMIBE 10 MG PO TABS
10.0000 mg | ORAL_TABLET | Freq: Every day | ORAL | Status: DC
  Administered 2021-04-20 – 2021-04-23 (×4): 10 mg via ORAL
  Filled 2021-04-20 (×5): qty 1

## 2021-04-20 NOTE — ED Notes (Signed)
Pt has on a purewick 

## 2021-04-20 NOTE — ED Triage Notes (Signed)
Pt here with c/o sob with exertion and chills after having covid on  Dec 26th , pt has three covid shots , no fevers at home

## 2021-04-20 NOTE — ED Provider Notes (Signed)
[facility] DEPT Provider Note  CSN: [id number] Arrival date & time: 04/20/21 1255  Chief Complaint(s) No chief complaint on file.  HPI Jennifer Joyce is a 89 y.o. female with PMH anxiety, hypothyroidism, osteoporosis, HLD who presents emergency department for evaluation of shortness of breath and fatigue.  Patient states that she tested positive for COVID-19 in December 2022 and has not had clinical improvement since her diagnosis.  She states that she is having more shortness of breath on exertion but denies chest pain, abdominal pain, nausea, vomiting or other systemic symptoms.  She endorses chills.  HPI  Past Medical History Past Medical History:  Diagnosis Date   Anxiety    Arthritis of knee    Bilateral pneumonia    Cataract    Diverticulosis    Hypothyroidism    Nocturia    Nontoxic uninodular goiter    Osteoporosis    Pure hypercholesterolemia    Patient Active Problem List   Diagnosis Date Noted   Dyspnea 02/25/2011   Home Medication(s) Prior to Admission medications   Medication Sig Start Date End Date Taking? Authorizing Provider  amLODipine (NORVASC) 2.5 MG tablet Take 2.5 mg by mouth daily. 04/17/21   [provider]  atorvastatin (LIPITOR) 10 MG tablet 1/2 tablet daily     [provider]  benzonatate (TESSALON) 200 MG capsule Take 200 mg by mouth 3 (three) times daily as needed for cough. 04/07/21   [provider]  calcium citrate-vitamin D (CITRACAL+D) 315-200 MG-UNIT per tablet Take 1 tablet by mouth 2 (two) times daily.      [provider]  escitalopram (LEXAPRO) 20 MG tablet 1/4 tablet daily 12/11/10   [provider]  escitalopram (LEXAPRO) 5 MG tablet Take 5 mg by mouth daily. 03/27/21   [provider]  ezetimibe (ZETIA) 10 MG tablet Take 10 mg by mouth daily. 04/18/21   [provider]  famotidine (PEPCID) 20 MG tablet Take 20 mg by mouth at bedtime as needed.     [provider]  levothyroxine (SYNTHROID, LEVOTHROID) 75 MCG tablet Take 75 mcg by mouth daily.      [provider]  losartan (COZAAR) 25 MG tablet Take 25 mg by mouth daily. 04/11/21   [provider]  Multiple Vitamins-Minerals (PRESERVISION AREDS PO) Take 1 capsule by mouth 2 (two) times daily.      [provider]                                                                                                                                    Past Surgical History Past Surgical History:  Procedure Laterality Date   APPENDECTOMY     TONSILLECTOMY     Family History Family History  Problem Relation Age of Onset   Heart disease Father    Uterine cancer Mother        with mets to lung  Heart disease Brother    Hypertension Brother    Asthma Maternal Grandmother     Social History Social History   Tobacco Use   Smoking status: Never   Smokeless tobacco: Never   Tobacco comments:    spouse smoked for 20 years in the home with her  Substance Use Topics   Alcohol use: No   Drug use: No   Allergies Erythromycin, Erythromycin base, Other, Sulfamethoxazole, Codeine, Oyster extract, and Sulfa antibiotics  Review of Systems Review of Systems  Constitutional:  Positive for chills and fatigue.  Respiratory:  Positive for shortness of breath.    Physical Exam Vital Signs  I have reviewed the triage vital signs BP (!) 150/67    Pulse 79    Resp 19    SpO2 100%   Physical Exam Vitals and nursing note reviewed.  Constitutional:      General: She is not in acute distress.    Appearance: She is well-developed.  HENT:     Head: Normocephalic and atraumatic.  Eyes:     Conjunctiva/sclera: Conjunctivae normal.  Cardiovascular:     Rate and Rhythm: Normal rate and regular rhythm.     Heart sounds: No murmur heard. Pulmonary:     Effort: Pulmonary effort is normal. No respiratory distress.     Breath sounds: Normal breath sounds.   Abdominal:     Palpations: Abdomen is soft.     Tenderness: There is no abdominal tenderness.  Musculoskeletal:        General: No swelling.     Cervical back: Neck supple.  Skin:    General: Skin is warm and dry.     Capillary Refill: Capillary refill takes less than 2 seconds.  Neurological:     Mental Status: She is alert.  Psychiatric:        Mood and Affect: Mood normal.    ED Results and Treatments Labs (all labs ordered are listed, but only abnormal results are displayed) Labs Reviewed  COMPREHENSIVE METABOLIC PANEL - Abnormal; Notable for the following components:      Result Value   Sodium 134 (*)    Potassium 5.2 (*)    Creatinine, Ser 1.42 (*)    Albumin 2.8 (*)    GFR, Estimated 36 (*)    Anion gap 4 (*)    All other components within normal limits  BRAIN NATRIURETIC PEPTIDE  CBC WITH DIFFERENTIAL/PLATELET  TROPONIN I (HIGH SENSITIVITY)  TROPONIN I (HIGH SENSITIVITY)                                                                                                                         EKG  EKG Interpretation  Date/Time:    Ventricular Rate:    PR Interval:    QRS Duration:   QT Interval:    QTC Calculation:   R Axis:     Text Interpretation:         Radiology CT Angio Chest PE W and/or  Wo Contrast  Result Date: 04/20/2021 CLINICAL DATA:  Shortness of breath on exertion with chills. Recent COVID-19 infection. Clinical concern for pulmonary embolism. EXAM: CT ANGIOGRAPHY CHEST WITH CONTRAST TECHNIQUE: Multidetector CT imaging of the chest was performed using the standard protocol during bolus administration of intravenous contrast. Multiplanar CT image reconstructions and MIPs were obtained to evaluate the vascular anatomy. CONTRAST:  56mL OMNIPAQUE IOHEXOL 350 MG/ML SOLN COMPARISON:  Radiographs 04/20/2021. CT 11/10/2002 (without report). FINDINGS: Cardiovascular: The pulmonary arteries are well opacified with contrast to the level of the subsegmental  branches. There is no evidence of acute pulmonary embolism. Diffuse atherosclerosis of the aorta, great vessels and coronary arteries status post median sternotomy, aortic valve replacement and probable CABG. The heart size is normal. There is no pericardial effusion. Mediastinum/Nodes: There are no enlarged mediastinal, hilar or axillary lymph nodes.Small mediastinal lymph nodes appear unchanged. There is a small hiatal hernia. The thyroid gland and trachea demonstrate no significant findings. Lungs/Pleura: No pleural effusion or pneumothorax. There is some pleural thickening on the left. Evidence of chronic lung disease with emphysema, subpleural reticulation, mild traction bronchiectasis and multiple nodular airspace opacities in both lungs, similar to remote prior CT, but mildly progressive. Most of these are subpleural in location, although there is an intraparenchymal component at the right apex measuring up to 1.6 cm on image 23/6. Upper abdomen: No acute findings are seen within the visualized upper abdomen. There is a small cyst in the left hepatic lobe. Both kidneys demonstrate mild cortical thinning. Musculoskeletal/Chest wall: There is no chest wall mass or suspicious osseous finding. Previous median sternotomy. Prominent thoracic kyphosis. Review of the MIP images confirms the above findings. IMPRESSION: 1. No evidence of acute pulmonary embolism or other acute vascular findings in the chest. 2. Multifocal, predominantly peripheral airspace opacities in both lungs are similar to remote CT from 2004, but mildly progressive. Findings could relate to chronic atypical inflammation such as organizing pneumonia or vasculitis. Given the progression and recent COVID-19 infection, superimposed pneumonia not excluded. Recommend at least chest radiographic follow-up in 4-6 weeks. Chest CT follow-up may be warranted. 3. No adenopathy or significant pleural effusion. 4. Coronary and Aortic Atherosclerosis  (ICD10-I70.0). Post AVR and probable CABG. Electronically Signed   By: Richardean Sale M.D.   On: 04/20/2021 16:21   DG Chest Port 1 View  Result Date: 04/20/2021 CLINICAL DATA:  Shortness of breath EXAM: PORTABLE CHEST 1 VIEW COMPARISON:  Previous study done on 11/03/2011. Immediate previous study done on 06/15/2017 is not available for comparison due to technical difficulties. FINDINGS: Transverse diameter of heart is increased. Prosthetic cardiac valve is seen. There is no evidence pulmonary vascular congestion or pulmonary edema. There is faint linear density in the right parahilar region. Increased markings are seen in the lateral aspect of left lower lung fields. There is blunting of left lateral CP angle. There is no pneumothorax. IMPRESSION: There are no signs of alveolar pulmonary edema. Small linear density in the right parahilar region and increased markings in the lateral left lower lung fields may suggest scarring or pneumonitis. There is pleural density in the left lateral costophrenic angle suggesting small effusion or pleural thickening. Electronically Signed   By: Elmer Picker M.D.   On: 04/20/2021 13:43    Pertinent labs & imaging results that were available during my care of the patient were reviewed by me and considered in my medical decision making (see MDM for details).  Medications Ordered in ED Medications  cefTRIAXone (ROCEPHIN) 1 g  in sodium chloride 0.9 % 100 mL IVPB (has no administration in time range)  azithromycin (ZITHROMAX) 500 mg in sodium chloride 0.9 % 250 mL IVPB (has no administration in time range)  iohexol (OMNIPAQUE) 350 MG/ML injection 75 mL (60 mLs Intravenous Contrast Given 04/20/21 1556)                                                                                                                                     Procedures .1-3 Lead EKG Interpretation Performed by: Teressa Lower, MD Authorized by: Teressa Lower, MD     Interpretation:  normal     ECG rate assessment: normal     Rhythm: sinus rhythm     Ectopy: none     Conduction: normal    (including critical care time)  Medical Decision Making / ED Course  Patient seen emergency department for evaluation of fatigue and shortness of breath.  Physical exam is largely unremarkable.  Laboratory evaluation with mild hyperkalemia to 5.2, creatinine elevation to 1.42, troponin unremarkable, BMP unremarkable.  Chest x-ray with possible pneumonitis and a small pleural effusion.  ECG with normal sinus rhythm, no evidence of ischemia.  A CT PE was obtained in the setting of the patient's underlying COVID-19 which shows progressive multifocal pneumonia compared to previous.  I independently reviewed the scans and my findings agree with radiology read.  In regards the patient social determinants of health, she lives alone and is unable to care for herself from a medical standpoint.  With her progressive multifocal pneumonia and increasing fatigue, ceftriaxone azithromycin were started for the patient's underlying pneumonia and she will be admitted to the hospital for observation of symptomatic improvement.  Patient is maintaining her oxygen saturations on room air.       Final Clinical Impression(s) / ED Diagnoses Final diagnoses:  None     @PCDICTATION @    Teressa Lower, MD 04/20/21 1650

## 2021-04-20 NOTE — H&P (Signed)
History and Physical    Jennifer Joyce SAY:[id number] DOB: October 18, 1931 DOA: 04/20/2021  PCP: Leonides Sake, MD  Patient coming from: Home  I have personally briefly reviewed patient's old medical records in [place]  Chief Complaint: Shortness of breath  HPI: Jennifer Joyce is a 88 y.o. female with medical history significant for CAD s/p CABG, endocarditis s/p bioprosthetic AVR in 2013, CKD stage IIIb, hypothyroidism, hypertension, hyperlipidemia, depression/anxiety, hard of hearing who presented to the ED for evaluation of shortness of breath.  Patient states that she had initial COVID infection November 2022 around Thanksgiving time with URI symptoms.  She did not receive any specific treatment and seem to get better on her own.  Late December 2022 around Christmas time she had recurrent respiratory symptoms with shortness of breath and cough productive of clear sputum.  Her PCP, Dr. Lisbeth Ply, with [facility] prescribed one of the oral COVID-19 medications.  Patient does not recall which one it was but that she completed the full 5-day course.  Since then patient has had continued shortness of breath with any type of exertion.  She has continued cough.  She has been very weak and deconditioned.  She says normally she ambulates on her own but over the last 2 weeks she has been essentially immobile, spending most of her time in a recliner.  She says family has been leaving food in the house while she has quarantine but she has been too weak to cook the food.  She has therefore had decreased oral intake.  She says she feels too weak to safely ambulate on her own and does not feel comfortable returning home.  She denies any recent fevers, chills, diaphoresis, chest pain, nausea, vomiting.  She is having some abdominal and left flank pain.  She denies any diarrhea or constipation.  ED Course:  Initial vitals showed BP 137/69, pulse 74, RR 16, temperature not recorded,  SPO2 97% on room air.  Labs show sodium 134, potassium 5.2, bicarb 22, BUN 23, creatinine 1.42, serum glucose 90, AST 16, ALT 15, alk phos 59, total bilirubin 0.4, BNP 59.5, high-sensitivity troponin 3 Granocyte 4, WBC 7.2, hemoglobin 10.2, platelets 489,000.  SARS-CoV-2 PCR is positive.  Influenza A and B are negative.  Portable chest x-ray shows small linear density in the right perihilar region and increased markings in the lateral left lower lung fields.  Pleural density in the left lateral costophrenic angle noted.  CTA chest PE study is negative for evidence of PE or other acute vascular findings in the chest.  Multifocal predominantly peripheral airspace opacities in both lungs noted, similar but mildly progressed when compared to remote CT from 2004.  No adenopathy or significant pleural effusion noted.  Post AVR and CABG changes noted.  Patient was given IV ceftriaxone and azithromycin.  The hospitalist service was consulted to admit for further evaluation and management.  Review of Systems: All systems reviewed and are negative except as documented in history of present illness above.   Past Medical History:  Diagnosis Date   Anxiety    Arthritis of knee    Bilateral pneumonia    Cataract    Diverticulosis    Hypothyroidism    Nocturia    Nontoxic uninodular goiter    Osteoporosis    Pure hypercholesterolemia     Past Surgical History:  Procedure Laterality Date   APPENDECTOMY     TONSILLECTOMY      Social History:  reports that she has  never smoked. She has never used smokeless tobacco. She reports that she does not drink alcohol and does not use drugs.  Allergies  Allergen Reactions   Tape Other (See Comments)    TAPE CAN TEAR THE SKIN- FRAGILE!!   Atorvastatin Other (See Comments)    Made the patient's muscles hurt   Erythromycin Other (See Comments)    "Made me sick"- GI upset    Erythromycin Base Other (See Comments)    GI upset   Other Nausea And  Vomiting and Other (See Comments)    "Oysters- nausea & vomiting noted, 04/29"   Sulfamethoxazole Nausea And Vomiting   Codeine Nausea And Vomiting and Other (See Comments)    GI upset    Oyster Extract Nausea And Vomiting   Sulfa Antibiotics Nausea And Vomiting    Family History  Problem Relation Age of Onset   Heart disease Father    Uterine cancer Mother        with mets to lung   Heart disease Brother    Hypertension Brother    Asthma Maternal Grandmother      Prior to Admission medications   Medication Sig Start Date End Date Taking? Authorizing Provider  amLODipine (NORVASC) 2.5 MG tablet Take 2.5 mg by mouth daily. 04/17/21   [provider]  atorvastatin (LIPITOR) 10 MG tablet 1/2 tablet daily     [provider]  benzonatate (TESSALON) 200 MG capsule Take 200 mg by mouth 3 (three) times daily as needed for cough. 04/07/21   [provider]  calcium citrate-vitamin D (CITRACAL+D) 315-200 MG-UNIT per tablet Take 1 tablet by mouth 2 (two) times daily.      [provider]  escitalopram (LEXAPRO) 20 MG tablet 1/4 tablet daily 12/11/10   [provider]  escitalopram (LEXAPRO) 5 MG tablet Take 5 mg by mouth daily. 03/27/21   [provider]  ezetimibe (ZETIA) 10 MG tablet Take 10 mg by mouth daily. 04/18/21   [provider]  famotidine (PEPCID) 20 MG tablet Take 20 mg by mouth at bedtime as needed.    [provider]  levothyroxine (SYNTHROID, LEVOTHROID) 75 MCG tablet Take 75 mcg by mouth daily.      [provider]  losartan (COZAAR) 25 MG tablet Take 25 mg by mouth daily. 04/11/21   [provider]  Multiple Vitamins-Minerals (PRESERVISION AREDS PO) Take 1 capsule by mouth 2 (two) times daily.      [provider]    Physical Exam: Vitals:   04/20/21 1700 04/20/21 1830 04/20/21 1914 04/20/21 2030  BP: (!) 147/59 124/60  132/66  Pulse: 80 80  84  Resp: _0 Temp:   97.9  F (36.6 C)   TempSrc:   Oral   SpO2: 100% 100%  98%   Constitutional: Elderly woman resting supine in bed, NAD, calm, comfortable Eyes: PERRL, lids and conjunctivae normal ENMT: Mucous membranes are dry. Posterior pharynx clear of any exudate or lesions.Normal dentition.  Hard of hearing. Neck: normal, supple, no masses. Respiratory: Faint end expiratory wheezing lower lung fields. Normal respiratory effort. No accessory muscle use.  Cardiovascular: Regular rate and rhythm, no murmurs / rubs / gallops. No extremity edema. 2+ pedal pulses. Abdomen: no tenderness, no masses palpated. No hepatosplenomegaly. Bowel sounds positive.  Musculoskeletal: no clubbing / cyanosis. No joint deformity upper and lower extremities. Good ROM, no contractures. Normal muscle tone.  Skin: no rashes, lesions, ulcers. No induration Neurologic: CN 2-12 grossly  intact. Sensation intact. Strength 5/5 in all 4.  Psychiatric: Normal judgment and insight. Alert and oriented x 3. Normal mood.   Labs on Admission: I have personally reviewed following labs and imaging studies  CBC: Recent Labs  Lab 04/20/21 1706  WBC 7.2  NEUTROABS 5.2  HGB 10.2*  HCT 33.2*  MCV 93.8  PLT 196*   Basic Metabolic Panel: Recent Labs  Lab 04/20/21 1324  NA 134*  K 5.2*  CL 108  CO2 22  GLUCOSE 90  BUN 23  CREATININE 1.42*  CALCIUM 8.9   GFR: CrCl cannot be calculated (Unknown ideal weight.). Liver Function Tests: Recent Labs  Lab 04/20/21 1324  AST 16  ALT 15  ALKPHOS 59  BILITOT 0.4  PROT 7.2  ALBUMIN 2.8*   No results for input(s): LIPASE, AMYLASE in the last 168 hours. No results for input(s): AMMONIA in the last 168 hours. Coagulation Profile: No results for input(s): INR, PROTIME in the last 168 hours. Cardiac Enzymes: No results for input(s): CKTOTAL, CKMB, CKMBINDEX, TROPONINI in the last 168 hours. BNP (last 3 results) No results for input(s): PROBNP in the last 8760 hours. HbA1C: No results for  input(s): HGBA1C in the last 72 hours. CBG: No results for input(s): GLUCAP in the last 168 hours. Lipid Profile: No results for input(s): CHOL, HDL, LDLCALC, TRIG, CHOLHDL, LDLDIRECT in the last 72 hours. Thyroid Function Tests: No results for input(s): TSH, T4TOTAL, FREET4, T3FREE, THYROIDAB in the last 72 hours. Anemia Panel: No results for input(s): VITAMINB12, FOLATE, FERRITIN, TIBC, IRON, RETICCTPCT in the last 72 hours. Urine analysis: No results found for: COLORURINE, APPEARANCEUR, LABSPEC, [place], GLUCOSEU, HGBUR, BILIRUBINUR, KETONESUR, PROTEINUR, UROBILINOGEN, NITRITE, LEUKOCYTESUR  Radiological Exams on Admission: CT Angio Chest PE W and/or Wo Contrast  Result Date: 04/20/2021 CLINICAL DATA:  Shortness of breath on exertion with chills. Recent COVID-19 infection. Clinical concern for pulmonary embolism. EXAM: CT ANGIOGRAPHY CHEST WITH CONTRAST TECHNIQUE: Multidetector CT imaging of the chest was performed using the standard protocol during bolus administration of intravenous contrast. Multiplanar CT image reconstructions and MIPs were obtained to evaluate the vascular anatomy. CONTRAST:  18m OMNIPAQUE IOHEXOL 350 MG/ML SOLN COMPARISON:  Radiographs 04/20/2021. CT 11/10/2002 (without report). FINDINGS: Cardiovascular: The pulmonary arteries are well opacified with contrast to the level of the subsegmental branches. There is no evidence of acute pulmonary embolism. Diffuse atherosclerosis of the aorta, great vessels and coronary arteries status post median sternotomy, aortic valve replacement and probable CABG. The heart size is normal. There is no pericardial effusion. Mediastinum/Nodes: There are no enlarged mediastinal, hilar or axillary lymph nodes.Small mediastinal lymph nodes appear unchanged. There is a small hiatal hernia. The thyroid gland and trachea demonstrate no significant findings. Lungs/Pleura: No pleural effusion or pneumothorax. There is some pleural thickening on the  left. Evidence of chronic lung disease with emphysema, subpleural reticulation, mild traction bronchiectasis and multiple nodular airspace opacities in both lungs, similar to remote prior CT, but mildly progressive. Most of these are subpleural in location, although there is an intraparenchymal component at the right apex measuring up to 1.6 cm on image 23/6. Upper abdomen: No acute findings are seen within the visualized upper abdomen. There is a small cyst in the left hepatic lobe. Both kidneys demonstrate mild cortical thinning. Musculoskeletal/Chest wall: There is no chest wall mass or suspicious osseous finding. Previous median sternotomy. Prominent thoracic kyphosis. Review of the MIP images confirms the above findings. IMPRESSION: 1. No evidence of acute pulmonary embolism or other acute vascular  findings in the chest. 2. Multifocal, predominantly peripheral airspace opacities in both lungs are similar to remote CT from 2004, but mildly progressive. Findings could relate to chronic atypical inflammation such as organizing pneumonia or vasculitis. Given the progression and recent COVID-19 infection, superimposed pneumonia not excluded. Recommend at least chest radiographic follow-up in 4-6 weeks. Chest CT follow-up may be warranted. 3. No adenopathy or significant pleural effusion. 4. Coronary and Aortic Atherosclerosis (ICD10-I70.0). Post AVR and probable CABG. Electronically Signed   By: Richardean Sale M.D.   On: 04/20/2021 16:21   DG Chest Port 1 View  Result Date: 04/20/2021 CLINICAL DATA:  Shortness of breath EXAM: PORTABLE CHEST 1 VIEW COMPARISON:  Previous study done on 11/03/2011. Immediate previous study done on 06/15/2017 is not available for comparison due to technical difficulties. FINDINGS: Transverse diameter of heart is increased. Prosthetic cardiac valve is seen. There is no evidence pulmonary vascular congestion or pulmonary edema. There is faint linear density in the right parahilar  region. Increased markings are seen in the lateral aspect of left lower lung fields. There is blunting of left lateral CP angle. There is no pneumothorax. IMPRESSION: There are no signs of alveolar pulmonary edema. Small linear density in the right parahilar region and increased markings in the lateral left lower lung fields may suggest scarring or pneumonitis. There is pleural density in the left lateral costophrenic angle suggesting small effusion or pleural thickening. Electronically Signed   By: Elmer Picker M.D.   On: 04/20/2021 13:43    EKG: Personally reviewed. Normal sinus rhythm without acute ischemic changes.  No prior for comparison.  Assessment/Plan Principal Problem:   COVID-19 virus infection Active Problems:   S/P AVR (aortic valve replacement)   CAD (coronary artery disease)   Stage 3b chronic kidney disease (CKD) (HCC)   Hyperkalemia   Generalized weakness   Essential hypertension   Hypothyroidism   Hyperlipidemia   Depression with anxiety   Jennifer Joyce is a 88 y.o. female with medical history significant for CAD s/p CABG, endocarditis s/p bioprosthetic AVR in 2013, CKD stage IIIb, hypothyroidism, hypertension, hyperlipidemia, depression/anxiety, hard of hearing who is admitted with generalized weakness, hyperkalemia, and multifocal pneumonia in setting of recent COVID-19 viral infection.  Multifocal peripheral airspace opacities in setting of COVID-19 viral infection: Multifocal peripheral airspace opacities seen on CT imaging, these were present on remote imaging but mildly progressed compared to prior.  Question of post-COVID inflammation or secondary pneumonia.  She has continued dyspnea, cough, and expiratory wheezing on exam.  She reports that she recently completed outpatient oral antiviral treatment for COVID infection. -Continue empiric IV ceftriaxone and azithromycin -Start on oral Decadron 6 mg daily -Start albuterol inhaler, incentive spirometer,  flutter valve -Supplemental oxygen if needed  Hyperkalemia: Mild.  Give Lokelma and continue IV fluid hydration overnight.  Generalized weakness: Likely deconditioning from COVID-19 infection plus dehydration due to poor oral intake.  Start on IV fluid hydration with NS_0  mL/hour overnight.  Request PT/OT eval.  Left flank pain: Patient reports acute left flank pain.  Has history of ureteral obstruction.  Will obtain renal ultrasound.  CKD stage IIIb: Renal function at baseline.  Continue to monitor.  Hypertension: Continue amlodipine, hold losartan with hyperkalemia.  CAD s/p CABG: Stable, continue aspirin and Zetia.  S/p bioprosthetic aortic valve replacement: In 2013 for endocarditis.  Prosthetic aortic valve with normal function on TTE 07/23/2020.  Hypothyroidism: Continue Synthroid.  Hyperlipidemia: Continue Zetia.  Depression/anxiety: Continue Lexapro.  DVT prophylaxis: Subcutaneous heparin Code Status:  Full code, confirmed with patient on admission Family Communication: Discussed with patient, she has discussed with family Disposition Plan: From home, dispo pending clinical progress and PT/OT eval Consults called: None Level of care: Med-Surg Admission status:  Status is: Observation  The patient remains OBS appropriate and will d/c before 2 midnights.  Zada Finders MD [facility]  If 7PM-7AM, please contact night-coverage [email or web address]  04/20/2021, 9:04 PM

## 2021-04-21 DIAGNOSIS — I2510 Atherosclerotic heart disease of native coronary artery without angina pectoris: Secondary | ICD-10-CM | POA: Diagnosis present

## 2021-04-21 DIAGNOSIS — E860 Dehydration: Secondary | ICD-10-CM | POA: Diagnosis present

## 2021-04-21 DIAGNOSIS — F419 Anxiety disorder, unspecified: Secondary | ICD-10-CM | POA: Diagnosis present

## 2021-04-21 DIAGNOSIS — Z8249 Family history of ischemic heart disease and other diseases of the circulatory system: Secondary | ICD-10-CM | POA: Diagnosis not present

## 2021-04-21 DIAGNOSIS — Z8616 Personal history of COVID-19: Secondary | ICD-10-CM | POA: Diagnosis not present

## 2021-04-21 DIAGNOSIS — Z79890 Hormone replacement therapy (postmenopausal): Secondary | ICD-10-CM | POA: Diagnosis not present

## 2021-04-21 DIAGNOSIS — I10 Essential (primary) hypertension: Secondary | ICD-10-CM

## 2021-04-21 DIAGNOSIS — R531 Weakness: Secondary | ICD-10-CM

## 2021-04-21 DIAGNOSIS — U099 Post covid-19 condition, unspecified: Secondary | ICD-10-CM | POA: Diagnosis present

## 2021-04-21 DIAGNOSIS — N1832 Chronic kidney disease, stage 3b: Secondary | ICD-10-CM

## 2021-04-21 DIAGNOSIS — M810 Age-related osteoporosis without current pathological fracture: Secondary | ICD-10-CM | POA: Diagnosis present

## 2021-04-21 DIAGNOSIS — E7800 Pure hypercholesterolemia, unspecified: Secondary | ICD-10-CM | POA: Diagnosis present

## 2021-04-21 DIAGNOSIS — Z79899 Other long term (current) drug therapy: Secondary | ICD-10-CM | POA: Diagnosis not present

## 2021-04-21 DIAGNOSIS — U071 COVID-19: Secondary | ICD-10-CM | POA: Diagnosis not present

## 2021-04-21 DIAGNOSIS — E872 Acidosis, unspecified: Secondary | ICD-10-CM | POA: Diagnosis present

## 2021-04-21 DIAGNOSIS — Z953 Presence of xenogenic heart valve: Secondary | ICD-10-CM | POA: Diagnosis not present

## 2021-04-21 DIAGNOSIS — R918 Other nonspecific abnormal finding of lung field: Secondary | ICD-10-CM | POA: Diagnosis not present

## 2021-04-21 DIAGNOSIS — F418 Other specified anxiety disorders: Secondary | ICD-10-CM | POA: Diagnosis not present

## 2021-04-21 DIAGNOSIS — R0602 Shortness of breath: Secondary | ICD-10-CM | POA: Diagnosis present

## 2021-04-21 DIAGNOSIS — F32A Depression, unspecified: Secondary | ICD-10-CM | POA: Diagnosis present

## 2021-04-21 DIAGNOSIS — J189 Pneumonia, unspecified organism: Secondary | ICD-10-CM | POA: Diagnosis present

## 2021-04-21 DIAGNOSIS — E875 Hyperkalemia: Secondary | ICD-10-CM | POA: Diagnosis present

## 2021-04-21 DIAGNOSIS — R5381 Other malaise: Secondary | ICD-10-CM | POA: Diagnosis present

## 2021-04-21 DIAGNOSIS — E039 Hypothyroidism, unspecified: Secondary | ICD-10-CM

## 2021-04-21 DIAGNOSIS — I129 Hypertensive chronic kidney disease with stage 1 through stage 4 chronic kidney disease, or unspecified chronic kidney disease: Secondary | ICD-10-CM | POA: Diagnosis present

## 2021-04-21 DIAGNOSIS — N179 Acute kidney failure, unspecified: Secondary | ICD-10-CM | POA: Diagnosis present

## 2021-04-21 DIAGNOSIS — Z951 Presence of aortocoronary bypass graft: Secondary | ICD-10-CM | POA: Diagnosis not present

## 2021-04-21 DIAGNOSIS — Z8049 Family history of malignant neoplasm of other genital organs: Secondary | ICD-10-CM | POA: Diagnosis not present

## 2021-04-21 LAB — COMPREHENSIVE METABOLIC PANEL WITH GFR
ALT: 15 U/L (ref 0–44)
AST: 16 U/L (ref 15–41)
Albumin: 2.8 g/dL — ABNORMAL LOW (ref 3.5–5.0)
Alkaline Phosphatase: 58 U/L (ref 38–126)
Anion gap: 11 (ref 5–15)
BUN: 23 mg/dL (ref 8–23)
CO2: 19 mmol/L — ABNORMAL LOW (ref 22–32)
Calcium: 9.2 mg/dL (ref 8.9–10.3)
Chloride: 109 mmol/L (ref 98–111)
Creatinine, Ser: 1.34 mg/dL — ABNORMAL HIGH (ref 0.44–1.00)
GFR, Estimated: 38 mL/min — ABNORMAL LOW
Glucose, Bld: 127 mg/dL — ABNORMAL HIGH (ref 70–99)
Potassium: 5.5 mmol/L — ABNORMAL HIGH (ref 3.5–5.1)
Sodium: 139 mmol/L (ref 135–145)
Total Bilirubin: 0.4 mg/dL (ref 0.3–1.2)
Total Protein: 7 g/dL (ref 6.5–8.1)

## 2021-04-21 LAB — FERRITIN: Ferritin: 137 ng/mL (ref 11–307)

## 2021-04-21 LAB — PHOSPHORUS: Phosphorus: 4.9 mg/dL — ABNORMAL HIGH (ref 2.5–4.6)

## 2021-04-21 LAB — CBC WITH DIFFERENTIAL/PLATELET
Abs Immature Granulocytes: 0.06 K/uL (ref 0.00–0.07)
Basophils Absolute: 0 K/uL (ref 0.0–0.1)
Basophils Relative: 1 %
Eosinophils Absolute: 0 K/uL (ref 0.0–0.5)
Eosinophils Relative: 0 %
HCT: 33.5 % — ABNORMAL LOW (ref 36.0–46.0)
Hemoglobin: 10.3 g/dL — ABNORMAL LOW (ref 12.0–15.0)
Immature Granulocytes: 1 %
Lymphocytes Relative: 5 %
Lymphs Abs: 0.4 K/uL — ABNORMAL LOW (ref 0.7–4.0)
MCH: 29.2 pg (ref 26.0–34.0)
MCHC: 30.7 g/dL (ref 30.0–36.0)
MCV: 94.9 fL (ref 80.0–100.0)
Monocytes Absolute: 0.1 K/uL (ref 0.1–1.0)
Monocytes Relative: 1 %
Neutro Abs: 7.4 K/uL (ref 1.7–7.7)
Neutrophils Relative %: 92 %
Platelets: 490 K/uL — ABNORMAL HIGH (ref 150–400)
RBC: 3.53 MIL/uL — ABNORMAL LOW (ref 3.87–5.11)
RDW: 13.6 % (ref 11.5–15.5)
WBC: 8.1 K/uL (ref 4.0–10.5)
nRBC: 0 % (ref 0.0–0.2)

## 2021-04-21 LAB — C-REACTIVE PROTEIN: CRP: 1 mg/dL — ABNORMAL HIGH (ref <1.0)

## 2021-04-21 LAB — RESPIRATORY PANEL BY PCR

## 2021-04-21 LAB — POTASSIUM: Potassium: 4.5 mmol/L (ref 3.5–5.1)

## 2021-04-21 LAB — MAGNESIUM: Magnesium: 1.9 mg/dL (ref 1.7–2.4)

## 2021-04-21 LAB — D-DIMER, QUANTITATIVE: D-Dimer, Quant: 0.94 ug/mL-FEU — ABNORMAL HIGH (ref 0.00–0.50)

## 2021-04-21 LAB — PROCALCITONIN: Procalcitonin: <0.1 ng/mL

## 2021-04-21 MED ORDER — SODIUM ZIRCONIUM CYCLOSILICATE 10 G PO PACK
10.0000 g | PACK | Freq: Once | ORAL | Status: AC
  Administered 2021-04-21: 10 g via ORAL
  Filled 2021-04-21: qty 1

## 2021-04-21 MED ORDER — ADULT MULTIVITAMIN W/MINERALS CH
1.0000 | ORAL_TABLET | Freq: Every day | ORAL | Status: DC
  Administered 2021-04-21 – 2021-04-24 (×4): 1 via ORAL
  Filled 2021-04-21 (×4): qty 1

## 2021-04-21 NOTE — Assessment & Plan Note (Addendum)
Possibly related to CKD. Received Lokelma 5 g x1 on admission. Potassium slightly worsened. Jennifer Joyce

## 2021-04-21 NOTE — Assessment & Plan Note (Signed)
Continue Lexapro

## 2021-04-21 NOTE — Assessment & Plan Note (Signed)
Continue aspirin 

## 2021-04-21 NOTE — Assessment & Plan Note (Signed)
Continue Synthroid °

## 2021-04-21 NOTE — Assessment & Plan Note (Signed)
Acute component on chronic component. Recent COVID-19 viral infection. Possible post-COVID inflammation vs secondary pneumonia. Started on Ceftriaxone and azithromycin. Procalcitonin undetectable. No leukocytosis. -Check RVP -Discontinue antibiotics

## 2021-04-21 NOTE — Assessment & Plan Note (Addendum)
Patient is on losartan, amlodipine as an outpatient. Losartan held secondary to soft blood pressure. -Continue amlodipine and Lipitor

## 2021-04-21 NOTE — Hospital Course (Signed)
Jennifer Joyce is a 88 y.o. female with a history of CAD s/p CABG, endocarditis s/p bioprosthetic AVR in 2013, CKD stage IIIb, hypothyroidism, hypertension, hyperlipidemia, depression/anxiety, hard of hearing. Patient presented secondary to shortness of breath and found to have multifocal infiltrates on CT imaging with concern for possible community acquired pneumonia and/or post-covid inflammation. Patient started on steroids and antibiotics.

## 2021-04-21 NOTE — Assessment & Plan Note (Signed)
Baseline creatinine is appears to be about 1.4. Stable.

## 2021-04-21 NOTE — Progress Notes (Signed)
PROGRESS NOTE    Jennifer Joyce  JIR:[id number] DOB: 1931/05/23 DOA: 04/20/2021 PCP: Leonides Sake, MD   Brief Narrative: Jennifer Joyce is a 88 y.o. female with a history of CAD s/p CABG, endocarditis s/p bioprosthetic AVR in 2013, CKD stage IIIb, hypothyroidism, hypertension, hyperlipidemia, depression/anxiety, hard of hearing. Patient presented secondary to shortness of breath and found to have multifocal infiltrates on CT imaging with concern for possible community acquired pneumonia and/or post-covid inflammation. Patient started on steroids and antibiotics.   Assessment & Plan:   * COVID-19 virus infection- (present on admission) Previously treated with for COVID-19. Continues to have a positive test. Started on steroids this admission secondary to possible worsened pulmonology infiltrates. CRP low at 1. -Continue Decadron  Multilobar lung infiltrate Acute component on chronic component. Recent COVID-19 viral infection. Possible post-COVID inflammation vs secondary pneumonia. Started on Ceftriaxone and azithromycin. Procalcitonin undetectable. No leukocytosis. -Check RVP -Discontinue antibiotics  Depression with anxiety- (present on admission) -Continue Lexapro  Hyperlipidemia- (present on admission) -Continue Zetia  Hypothyroidism- (present on admission) -Continue Synthroid  Essential hypertension- (present on admission) Patient is on losartan, amlodipine as an outpatient. Losartan held secondary to soft blood pressure. -Continue amlodipine and Lipitor  Generalized weakness PT/OT consulted. Recommendation for SNF.  Hyperkalemia- (present on admission) Possibly related to CKD. Received Lokelma 5 g x1 on admission. Potassium slightly worsened. -Lokelma  Stage 3b chronic kidney disease (CKD) ([place])- (present on admission) Baseline creatinine is appears to be about 1.4. Stable.  CAD (coronary artery disease)- (present on admission) -Continue aspirin  S/P AVR  (aortic valve replacement) Noted.     DVT prophylaxis: Heparin subq Code Status:   Code Status: Full Code Family Communication: None at bedside Disposition Plan: Discharge to SNF when bed is available. Medically stable for discharge.   Consultants:  None  Procedures:  None  Antimicrobials: Ceftriaxone IV Azithromycin    Subjective: No dyspnea. On room air.  Objective: Vitals:   04/21/21 0538 04/21/21 0539 04/21/21 0930 04/21/21 1214  BP: (!) 114/54 101/76 (!) 115/58 (!) 94/54  Pulse: 81 80 79 91  Resp: 18 18 18 20   Temp: 97.9 F (36.6 C) 97.9 F (36.6 C) 99 F (37.2 C) 97.6 F (36.4 C)  TempSrc: Oral  Oral Oral  SpO2: 96% 96% 99% 100%    Intake/Output Summary (Last 24 hours) at 04/21/2021 1543 Last data filed at 04/21/2021 0900 Gross per 24 hour  Intake 900.42 ml  Output --  Net 900.42 ml   There were no vitals filed for this visit.  Examination:  General exam: Appears calm and comfortable Respiratory system: Clear/diminished on auscultation. Respiratory effort normal. Cardiovascular system: S1 & S2 heard, RRR. Gastrointestinal system: Abdomen is nondistended, soft and nontender. No organomegaly or masses felt. Normal bowel sounds heard. Central nervous system: Alert and oriented. No focal neurological deficits. Musculoskeletal: No edema. No calf tenderness Skin: No cyanosis. No rashes Psychiatry: Judgement and insight appear normal. Mood & affect appropriate.     Data Reviewed: I have personally reviewed following labs and imaging studies  CBC Lab Results  Component Value Date   WBC 8.1 04/21/2021   RBC 3.53 (L) 04/21/2021   HGB 10.3 (L) 04/21/2021   HCT 33.5 (L) 04/21/2021   MCV 94.9 04/21/2021   MCH 29.2 04/21/2021   PLT 490 (H) 04/21/2021   MCHC 30.7 04/21/2021   RDW 13.6 04/21/2021   LYMPHSABS 0.4 (L) 04/21/2021   MONOABS 0.1 04/21/2021   EOSABS 0.0 04/21/2021   BASOSABS 0.0  81/44/8185     Last metabolic panel Lab Results   Component Value Date   NA 139 04/21/2021   K 5.5 (H) 04/21/2021   CL 109 04/21/2021   CO2 19 (L) 04/21/2021   BUN 23 04/21/2021   CREATININE 1.34 (H) 04/21/2021   GLUCOSE 127 (H) 04/21/2021   GFRNONAA 38 (L) 04/21/2021   CALCIUM 9.2 04/21/2021   PHOS 4.9 (H) 04/21/2021   PROT 7.0 04/21/2021   ALBUMIN 2.8 (L) 04/21/2021   BILITOT 0.4 04/21/2021   ALKPHOS 58 04/21/2021   AST 16 04/21/2021   ALT 15 04/21/2021   ANIONGAP 11 04/21/2021    CBG (last 3)  No results for input(s): GLUCAP in the last 72 hours.   GFR: CrCl cannot be calculated (Unknown ideal weight.).  Coagulation Profile: No results for input(s): INR, PROTIME in the last 168 hours.  Recent Results (from the past 240 hour(s))  Resp Panel by RT-PCR (Flu A&B, Covid) Nasopharyngeal Swab     Status: Abnormal   Collection Time: 04/20/21  5:01 PM   Specimen: Nasopharyngeal Swab; Nasopharyngeal(NP) swabs in vial transport medium  Result Value Ref Range Status   SARS Coronavirus 2 by RT PCR POSITIVE (A) NEGATIVE Final    Comment: (NOTE) SARS-CoV-2 target nucleic acids are DETECTED.  The SARS-CoV-2 RNA is generally detectable in upper respiratory specimens during the acute phase of infection. Positive results are indicative of the presence of the identified virus, but do not rule out bacterial infection or co-infection with other pathogens not detected by the test. Clinical correlation with patient history and other diagnostic information is necessary to determine patient infection status. The expected result is Negative.  Fact Sheet for Patients: EntrepreneurPulse.com.au  Fact Sheet for Healthcare Providers: IncredibleEmployment.be  This test is not yet approved or cleared by the [place] FDA and  has been authorized for detection and/or diagnosis of SARS-CoV-2 by FDA under an Emergency Use Authorization (EUA).  This EUA will remain in effect (meaning this test can be  used) for the duration of  the COVID-19 declaration under Section 564(b)(1) of the A ct, 21 U.S.C. section 360bbb-3(b)(1), unless the authorization is terminated or revoked sooner.     Influenza A by PCR NEGATIVE NEGATIVE Final   Influenza B by PCR NEGATIVE NEGATIVE Final    Comment: (NOTE) The Xpert Xpress SARS-CoV-2/FLU/RSV plus assay is intended as an aid in the diagnosis of influenza from Nasopharyngeal swab specimens and should not be used as a sole basis for treatment. Nasal washings and aspirates are unacceptable for Xpert Xpress SARS-CoV-2/FLU/RSV testing.  Fact Sheet for Patients: EntrepreneurPulse.com.au  Fact Sheet for Healthcare Providers: IncredibleEmployment.be  This test is not yet approved or cleared by the [place] FDA and has been authorized for detection and/or diagnosis of SARS-CoV-2 by FDA under an Emergency Use Authorization (EUA). This EUA will remain in effect (meaning this test can be used) for the duration of the COVID-19 declaration under Section 564(b)(1) of the Act, 21 U.S.C. section 360bbb-3(b)(1), unless the authorization is terminated or revoked.  Performed at [facility], [place] [address], [place], [place] [address]         Radiology Studies: CT Angio Chest PE W and/or Wo Contrast  Result Date: 04/20/2021 CLINICAL DATA:  Shortness of breath on exertion with chills. Recent COVID-19 infection. Clinical concern for pulmonary embolism. EXAM: CT ANGIOGRAPHY CHEST WITH CONTRAST TECHNIQUE: Multidetector CT imaging of the chest was performed using the standard protocol during bolus administration of intravenous contrast.  Multiplanar CT image reconstructions and MIPs were obtained to evaluate the vascular anatomy. CONTRAST:  63mL OMNIPAQUE IOHEXOL 350 MG/ML SOLN COMPARISON:  Radiographs 04/20/2021. CT 11/10/2002 (without report). FINDINGS: Cardiovascular: The pulmonary arteries are well opacified  with contrast to the level of the subsegmental branches. There is no evidence of acute pulmonary embolism. Diffuse atherosclerosis of the aorta, great vessels and coronary arteries status post median sternotomy, aortic valve replacement and probable CABG. The heart size is normal. There is no pericardial effusion. Mediastinum/Nodes: There are no enlarged mediastinal, hilar or axillary lymph nodes.Small mediastinal lymph nodes appear unchanged. There is a small hiatal hernia. The thyroid gland and trachea demonstrate no significant findings. Lungs/Pleura: No pleural effusion or pneumothorax. There is some pleural thickening on the left. Evidence of chronic lung disease with emphysema, subpleural reticulation, mild traction bronchiectasis and multiple nodular airspace opacities in both lungs, similar to remote prior CT, but mildly progressive. Most of these are subpleural in location, although there is an intraparenchymal component at the right apex measuring up to 1.6 cm on image 23/6. Upper abdomen: No acute findings are seen within the visualized upper abdomen. There is a small cyst in the left hepatic lobe. Both kidneys demonstrate mild cortical thinning. Musculoskeletal/Chest wall: There is no chest wall mass or suspicious osseous finding. Previous median sternotomy. Prominent thoracic kyphosis. Review of the MIP images confirms the above findings. IMPRESSION: 1. No evidence of acute pulmonary embolism or other acute vascular findings in the chest. 2. Multifocal, predominantly peripheral airspace opacities in both lungs are similar to remote CT from 2004, but mildly progressive. Findings could relate to chronic atypical inflammation such as organizing pneumonia or vasculitis. Given the progression and recent COVID-19 infection, superimposed pneumonia not excluded. Recommend at least chest radiographic follow-up in 4-6 weeks. Chest CT follow-up may be warranted. 3. No adenopathy or significant pleural effusion. 4.  Coronary and Aortic Atherosclerosis (ICD10-I70.0). Post AVR and probable CABG. Electronically Signed   By: Richardean Sale M.D.   On: 04/20/2021 16:21   US RENAL  Result Date: 04/20/2021 CLINICAL DATA:  Left flank pain. EXAM: RENAL / URINARY TRACT ULTRASOUND COMPLETE COMPARISON:  Renal ultrasound dated 10/13/2011. FINDINGS: Right Kidney: Renal measurements: 9.1 x 5.3 x 5.0 cm = volume: 126 mL. There is increased echogenicity and parenchyma atrophy and cortical thinning. Moderate pelviectasis. No shadowing stone. Left Kidney: Renal measurements: 8.8 x 4.2 x 5.0 cm = volume: 97 mL. Increased echogenicity with parenchyma atrophy. No hydronephrosis or shadowing stone. Bladder: Appears normal for degree of bladder distention. Other: None. IMPRESSION: 1. Atrophic and echogenic kidneys in keeping with chronic kidney disease. 2. Moderate right pelviectasis.  No shadowing stone. Electronically Signed   By: Anner Crete M.D.   On: 04/20/2021 21:48   DG Chest Port 1 View  Result Date: 04/20/2021 CLINICAL DATA:  Shortness of breath EXAM: PORTABLE CHEST 1 VIEW COMPARISON:  Previous study done on 11/03/2011. Immediate previous study done on 06/15/2017 is not available for comparison due to technical difficulties. FINDINGS: Transverse diameter of heart is increased. Prosthetic cardiac valve is seen. There is no evidence pulmonary vascular congestion or pulmonary edema. There is faint linear density in the right parahilar region. Increased markings are seen in the lateral aspect of left lower lung fields. There is blunting of left lateral CP angle. There is no pneumothorax. IMPRESSION: There are no signs of alveolar pulmonary edema. Small linear density in the right parahilar region and increased markings in the lateral left lower lung fields may suggest  scarring or pneumonitis. There is pleural density in the left lateral costophrenic angle suggesting small effusion or pleural thickening. Electronically Signed   By:  Elmer Picker M.D.   On: 04/20/2021 13:43     Scheduled Meds:  albuterol  2 puff Inhalation Q6H   amLODipine  2.5 mg Oral Daily   aspirin EC  81 mg Oral Daily   dexamethasone  6 mg Oral Q24H   escitalopram  5 mg Oral QHS   ezetimibe  10 mg Oral QHS   heparin  5,000 Units Subcutaneous Q8H   levothyroxine  75 mcg Oral QAC breakfast   multivitamin with minerals  1 tablet Oral Daily   sodium zirconium cyclosilicate  10 g Oral Once   Continuous Infusions:  azithromycin 500 mg (04/21/21 1124)   cefTRIAXone (ROCEPHIN)  IV Stopped (04/20/21 1747)     LOS: 0 days     Cordelia Poche, MD [facility] 04/21/2021, 3:43 PM  If 7PM-7AM, please contact night-coverage [email or web address]

## 2021-04-21 NOTE — Assessment & Plan Note (Signed)
PT/OT consulted. Recommendation for SNF.

## 2021-04-21 NOTE — Assessment & Plan Note (Addendum)
Previously treated with for COVID-19. Continues to have a positive test. Started on steroids this admission secondary to possible worsened pulmonology infiltrates. CRP low at 1. -Continue Decadron

## 2021-04-21 NOTE — Assessment & Plan Note (Signed)
Continue Zetia. °

## 2021-04-21 NOTE — Assessment & Plan Note (Signed)
Noted  

## 2021-04-21 NOTE — Evaluation (Signed)
Physical Therapy Evaluation Patient Details Name: Jennifer Joyce MRN: [id number] DOB: 18-Sep-1931 Today's Date: 04/21/2021  History of Present Illness  88 yo female admitted with COVID, weakness. Hx of CAD, CABG, CKD, osteoporosis.  Clinical Impression  On eval, pt required Min A for mobility. She walked ~40 feet with a RW. Pt presents with general weakness, decreased activity tolerance, and impaired gait and balance. Discussed f/u PT options and d/c plan. Pt has some concerns about going to a facility but she also expresses concern about being able to manage at home alone. She lives alone and likely will not have assistance available at home. Spoke briefly with Jennifer Joyce (pt's niece-with pt's permission)-Jennifer Joyce confirms that pt will not have the needed assistance to go back home and safely manage her own care. PT recommendation is for ST SNF rehab.        Recommendations for follow up therapy are one component of a multi-disciplinary discharge planning process, led by the attending physician.  Recommendations may be updated based on patient status, additional functional criteria and insurance authorization.  Follow Up Recommendations Skilled nursing-short term rehab (<3 hours/day)    Assistance Recommended at Discharge Frequent or constant Supervision/Assistance  Patient can return home with the following  A little help with walking and/or transfers;A little help with bathing/dressing/bathroom;Assistance with cooking/housework;Help with stairs or ramp for entrance    Equipment Recommendations Rolling walker (2 wheels)  Recommendations for Other Services       Functional Status Assessment Patient has had a recent decline in their functional status and demonstrates the ability to make significant improvements in function in a reasonable and predictable amount of time.     Precautions / Restrictions Precautions Precautions: Fall Restrictions Weight Bearing Restrictions: No      Mobility   Bed Mobility Overal bed mobility: Needs Assistance Bed Mobility: Sit to Supine       Sit to supine: Supervision   General bed mobility comments: For safety, lines.    Transfers Overall transfer level: Needs assistance Equipment used: Rolling walker (2 wheels) Transfers: Sit to/from Stand Sit to Stand: Min guard           General transfer comment: Min guard for safety. Cues for hand placement. BP 117/62    Ambulation/Gait Ambulation/Gait assistance: Min assist Gait Distance (Feet): 40 Feet Assistive device: Rolling walker (2 wheels) Gait Pattern/deviations: Step-through pattern;Decreased stride length       General Gait Details: Assist to steady pt throughout distance. Pt c/o fatigue and shakiness after ~20 feet. She was able to make it back to the room but she was very fatigued.  Stairs            Wheelchair Mobility    Modified Rankin (Stroke Patients Only)       Balance Overall balance assessment: Needs assistance         Standing balance support: Bilateral upper extremity supported;Reliant on assistive device for balance Standing balance-Leahy Scale: Fair                               Pertinent Vitals/Pain Pain Assessment: Faces Faces Pain Scale: Hurts a little bit Pain Location: chest, UEs Pain Descriptors / Indicators: Discomfort;Sore Pain Intervention(s): Limited activity within patient's tolerance;Monitored during session;Repositioned    Home Living Family/patient expects to be discharged to:: Unsure Living Arrangements: Alone   Type of Home: House Home Access: Stairs to enter Entrance Stairs-Rails: Right Entrance Stairs-Number of Steps: 1 flight  Home Layout: Able to live on main level with bedroom/bathroom Home Equipment: None      Prior Function Prior Level of Function : Independent/Modified Independent                     Hand Dominance        Extremity/Trunk Assessment   Upper Extremity  Assessment Upper Extremity Assessment: Defer to OT evaluation    Lower Extremity Assessment Lower Extremity Assessment: Generalized weakness    Cervical / Trunk Assessment Cervical / Trunk Assessment: Normal  Communication   Communication: No difficulties  Cognition Arousal/Alertness: Awake/alert Behavior During Therapy: WFL for tasks assessed/performed Overall Cognitive Status: Within Functional Limits for tasks assessed                                          General Comments      Exercises     Assessment/Plan    PT Assessment Patient needs continued PT services  PT Problem List Decreased strength;Decreased mobility;Decreased activity tolerance;Decreased balance;Decreased knowledge of use of DME       PT Treatment Interventions DME instruction;Gait training;Therapeutic activities;Therapeutic exercise;Patient/family education;Functional mobility training;Balance training    PT Goals (Current goals can be found in the Care Plan section)  Acute Rehab PT Goals Patient Stated Goal: to get stronger and to not fall PT Goal Formulation: With patient Time For Goal Achievement: 05/05/21 Potential to Achieve Goals: Good    Frequency Min 3X/week     Co-evaluation               AM-PAC PT "6 Clicks" Mobility  Outcome Measure Help needed turning from your back to your side while in a flat bed without using bedrails?: None Help needed moving from lying on your back to sitting on the side of a flat bed without using bedrails?: A Little Help needed moving to and from a bed to a chair (including a wheelchair)?: A Little Help needed standing up from a chair using your arms (e.g., wheelchair or bedside chair)?: A Little Help needed to walk in hospital room?: A Little Help needed climbing 3-5 steps with a railing? : A Lot 6 Click Score: 18    End of Session Equipment Utilized During Treatment: Gait belt Activity Tolerance: Patient limited by  fatigue;Patient tolerated treatment well Patient left: in bed;with call bell/phone within reach   PT Visit Diagnosis: Muscle weakness (generalized) (M62.81);Difficulty in walking, not elsewhere classified (R26.2)    Time: 5638-7564 PT Time Calculation (min) (ACUTE ONLY): 32 min   Charges:   PT Evaluation $PT Eval Moderate Complexity: 1 Mod PT Treatments $Gait Training: 8-22 mins          Doreatha Massed, PT Acute Rehabilitation  Office: [phone] Pager: [phone]

## 2021-04-22 LAB — BASIC METABOLIC PANEL
Anion gap: 9 (ref 5–15)
BUN: 31 mg/dL — ABNORMAL HIGH (ref 8–23)
CO2: 19 mmol/L — ABNORMAL LOW (ref 22–32)
Calcium: 8.9 mg/dL (ref 8.9–10.3)
Chloride: 107 mmol/L (ref 98–111)
Creatinine, Ser: 1.56 mg/dL — ABNORMAL HIGH (ref 0.44–1.00)
GFR, Estimated: 32 mL/min — ABNORMAL LOW (ref >60)
Glucose, Bld: 139 mg/dL — ABNORMAL HIGH (ref 70–99)
Potassium: 5.4 mmol/L — ABNORMAL HIGH (ref 3.5–5.1)
Sodium: 135 mmol/L (ref 135–145)

## 2021-04-22 MED ORDER — ALBUTEROL SULFATE HFA 108 (90 BASE) MCG/ACT IN AERS: 2.0000 | INHALATION_SPRAY | Freq: Four times a day (QID) | RESPIRATORY_TRACT | Status: DC | PRN

## 2021-04-22 MED ORDER — ALBUTEROL SULFATE HFA 108 (90 BASE) MCG/ACT IN AERS
2.0000 | INHALATION_SPRAY | Freq: Three times a day (TID) | RESPIRATORY_TRACT | Status: DC
  Administered 2021-04-23 – 2021-04-24 (×4): 2 via RESPIRATORY_TRACT

## 2021-04-22 NOTE — TOC Initial Note (Addendum)
Transition of Care [facility]) - Initial/Assessment Note    Patient Details  Name: Jennifer Joyce MRN: [id number] Date of Birth: Jul 15, 1931  Transition of [facility]) CM/SW Contact:    Jennifer [place], LCSW Phone Number: 04/22/2021, 11:29 AM  Clinical Narrative:   Patient seen in follow up to PT/OT recommendation of SNF.  While admitting that she is feeling "weak and wobbly,"  Jennifer Joyce also says that she has her appetite back and is feeling better than she was when she was admitted.  Her goal is to get home ASAP, and she is hopeful that she will be able to return from the hospital, even if it means hiring someone for a couple days to come into the home to be with here.  However, she also recognizes that she may need to go to short term reahb, and gave me permission to do a bed search.  She is hopeful that she would be able to get into [facility] as she was there in the 80's and had a good experience there. She would prefer to have a private room. Bed search initiated.  Niece Jennifer Joyce updated. TOC will continue to follow during the course of hospitalization.                Expected Discharge Plan: Skilled Nursing Facility Barriers to Discharge: SNF Pending bed offer   Patient Goals and CMS Choice     Choice offered to / list presented to : Patient  Expected Discharge Plan and Services Expected Discharge Plan: [place]   Discharge Planning Services: CM Consult Post Acute Care Choice: [place] Living arrangements for the past 2 months: Single Family Home                                      Prior Living Arrangements/Services Living arrangements for the past 2 months: Single Family Home Lives with:: Self Patient language and need for interpreter reviewed:: Yes Do you feel safe going back to the place where you live?: Yes      Need for Family Participation in Patient Care: Yes (Comment) Care giver support system in place?: Yes (comment)   Criminal  Activity/Legal Involvement Pertinent to Current Situation/Hospitalization: No - Comment as needed  Activities of Daily Living Home Assistive Devices/Equipment: Hearing aid, Eyeglasses (bilateral hearing aides) ADL Screening (condition at time of admission) Patient's cognitive ability adequate to safely complete daily activities?: Yes Is the patient deaf or have difficulty hearing?: Yes Does the patient have difficulty seeing, even when wearing glasses/contacts?: Yes (macular degeneration in both eyes) Does the patient have difficulty concentrating, remembering, or making decisions?: Yes (forgetful at times) Patient able to express need for assistance with ADLs?: Yes Does the patient have difficulty dressing or bathing?: Yes Independently performs ADLs?: Yes (appropriate for developmental age) Communication: Independent Dressing (OT): Independent Grooming: Independent Feeding: Independent Bathing: Independent Toileting: Needs assistance Is this a change from baseline?: Change from baseline, expected to last >3days In/Out Bed: Needs assistance Is this a change from baseline?: Change from baseline, expected to last >3 days Walks in Home: Needs assistance Is this a change from baseline?: Change from baseline, expected to last >3 days Does the patient have difficulty walking or climbing stairs?: Yes Weakness of Legs: Both Weakness of Arms/Hands: Both  Permission Sought/Granted Permission sought to share information with : Family Supports Permission granted to share information with : Yes, Verbal Permission  Granted  Share Information with NAME: Jennifer Joyce (Niece)   [phone]           Emotional Assessment Appearance:: Appears stated age Attitude/Demeanor/Rapport: Engaged Affect (typically observed): Appropriate Orientation: : Oriented to Self, Oriented to Place, Oriented to  Time, Oriented to Situation Alcohol / Substance Use: Not Applicable Psych Involvement: No  (comment)  Admission diagnosis:  Acute left flank pain [R10.9] Multifocal pneumonia [J18.9] COVID-19 virus infection [U07.1] Patient Active Problem List   Diagnosis Date Noted   Multilobar lung infiltrate 04/21/2021   COVID-19 virus infection 04/20/2021   S/P AVR (aortic valve replacement) 04/20/2021   CAD (coronary artery disease) 04/20/2021   Stage 3b chronic kidney disease (CKD) ([place]) 04/20/2021   Hyperkalemia 04/20/2021   Generalized weakness 04/20/2021   Essential hypertension 04/20/2021   Hypothyroidism    Hyperlipidemia    Depression with anxiety    Dyspnea 02/25/2011   PCP:  Jennifer Sake, MD Pharmacy:   [id number] - [place], [place] [address] Phone: [phone] Fax: [phone]     Social Determinants of Health (SDOH) Interventions    Readmission Risk Interventions No flowsheet data found.

## 2021-04-22 NOTE — Progress Notes (Signed)
Physical Therapy Treatment Patient Details Name: Jennifer Joyce MRN: [id number] DOB: Aug 13, 1931 Today's Date: 04/22/2021   History of Present Illness 88 yo female admitted with COVID, weakness. Hx of CAD, CABG, CKD, osteoporosis.    PT Comments    Pt is progressing well. She tolerated increased activity well. Pt reports she feels better than she did on admission. Discussed d/c plan again on today-she prefers to return home. She feels if she can stay in the hospital another day or two, she will be able to manage at home. Encouraged her to spend as much time out of bed as possible and to try to ambulate more with nursing supervision. If pt continues to progress well, she could potentially discharge home with HHPT f/u.     Recommendations for follow up therapy are one component of a multi-disciplinary discharge planning process, led by the attending physician.  Recommendations may be updated based on patient status, additional functional criteria and insurance authorization.  Follow Up Recommendations  Skilled nursing-short term rehab (<3 hours/day)--could go home with HHPT if she continues to progress well     Assistance Recommended at Discharge Intermittent Supervision/Assistance  Patient can return home with the following Assistance with cooking/housework;A little help with walking and/or transfers   Equipment Recommendations  Rolling walker (2 wheels) (continuing to assess-may not need)    Recommendations for Other Services       Precautions / Restrictions Precautions Precautions: Fall Restrictions Weight Bearing Restrictions: No     Mobility  Bed Mobility Overal bed mobility: Modified Independent                  Transfers Overall transfer level: Modified independent                      Ambulation/Gait Ambulation/Gait assistance: Supervision Gait Distance (Feet): 150 Feet Assistive device: Rollator (4 wheels) Gait Pattern/deviations: Step-through  pattern;Decreased stride length       General Gait Details: Supv for safety. Pt walked 150 feet with rollator. She also walked in room to/from bathroom without device. No overt LOB. Pt denied dizziness   Stairs             Wheelchair Mobility    Modified Rankin (Stroke Patients Only)       Balance Overall balance assessment: Mild deficits observed, not formally tested         Standing balance support: During functional activity Standing balance-Leahy Scale: Fair                              Cognition Arousal/Alertness: Awake/alert Behavior During Therapy: WFL for tasks assessed/performed Overall Cognitive Status: Within Functional Limits for tasks assessed                                          Exercises      General Comments        Pertinent Vitals/Pain Pain Assessment: No/denies pain    Home Living                          Prior Function            PT Goals (current goals can now be found in the care plan section) Progress towards PT goals: Progressing toward goals    Frequency  Min 3X/week      PT Plan Current plan remains appropriate    Co-evaluation              AM-PAC PT "6 Clicks" Mobility   Outcome Measure  Help needed turning from your back to your side while in a flat bed without using bedrails?: None Help needed moving from lying on your back to sitting on the side of a flat bed without using bedrails?: None Help needed moving to and from a bed to a chair (including a wheelchair)?: A Little Help needed standing up from a chair using your arms (e.g., wheelchair or bedside chair)?: A Little Help needed to walk in hospital room?: A Little Help needed climbing 3-5 steps with a railing? : A Little 6 Click Score: 20    End of Session   Activity Tolerance: Patient tolerated treatment well Patient left: in bed;with call bell/phone within reach   PT Visit Diagnosis: Muscle  weakness (generalized) (M62.81);Difficulty in walking, not elsewhere classified (R26.2)     Time: 1761-6073 PT Time Calculation (min) (ACUTE ONLY): 34 min  Charges:  $Gait Training: 23-37 mins                        Doreatha Massed, PT Acute Rehabilitation  Office: [phone] Pager: [phone]

## 2021-04-22 NOTE — NC FL2 (Signed)
[place] LEVEL OF CARE SCREENING TOOL     IDENTIFICATION  Patient Name: Jennifer Joyce Birthdate: [birth year] Sex: female Admission Date (Current Location): 04/20/2021  [facility] and [place] Number:  Herbalist and Address:  [facility],  [address] [place], [place]      Provider Number: [id number]  Attending Physician Name and Address:  Kayleen Memos, DO  Relative Name and Phone Number:  Adair Patter (Niece)   [phone]    Current Level of Care: Hospital Recommended Level of Care: [place] Prior Approval Number:    Date Approved/Denied:   PASRR Number: [id number]  Discharge Plan: SNF    Current Diagnoses: Patient Active Problem List   Diagnosis Date Noted   Multilobar lung infiltrate 04/21/2021   COVID-19 virus infection 04/20/2021   S/P AVR (aortic valve replacement) 04/20/2021   CAD (coronary artery disease) 04/20/2021   Stage 3b chronic kidney disease (CKD) ([place]) 04/20/2021   Hyperkalemia 04/20/2021   Generalized weakness 04/20/2021   Essential hypertension 04/20/2021   Hypothyroidism    Hyperlipidemia    Depression with anxiety    Dyspnea 02/25/2011    Orientation RESPIRATION BLADDER Height & Weight     Self, Time, Situation, Place  Normal External catheter Weight: 74.1 kg Height:     BEHAVIORAL SYMPTOMS/MOOD NEUROLOGICAL BOWEL NUTRITION STATUS      Continent Diet (see d/c summary)  AMBULATORY STATUS COMMUNICATION OF NEEDS Skin   Extensive Assist Verbally Normal                       Personal Care Assistance Level of Assistance  Bathing, Feeding, Dressing Bathing Assistance: Maximum assistance Feeding assistance: Independent Dressing Assistance: Limited assistance     Functional Limitations Info  Sight, Hearing, Speech Sight Info: Impaired Hearing Info: Impaired Speech Info: Adequate    SPECIAL CARE FACTORS FREQUENCY  PT (By licensed PT), OT (By licensed OT)     PT  Frequency: 5X/W OT Frequency: 5X/W            Contractures Contractures Info: Not present    Additional Factors Info  Code Status, Allergies Code Status Info: Full Allergies Info: Tape   Atorvastatin   Erythromycin   Erythromycin Base   Sulfamethoxazole   Codeine   Oyster Extract   Sulfa Antibiotics           Current Medications (04/22/2021):  This is the current hospital active medication list Current Facility-Administered Medications  Medication Dose Route Frequency Provider Last Rate Last Admin   acetaminophen (TYLENOL) tablet 650 mg  650 mg Oral Q6H PRN Lenore Cordia, MD   650 mg at 04/20/21 2250   albuterol (VENTOLIN HFA) 108 (90 Base) MCG/ACT inhaler 2 puff  2 puff Inhalation Q6H Zada Finders R, MD   2 puff at 04/22/21 0911   amLODipine (NORVASC) tablet 2.5 mg  2.5 mg Oral Daily Zada Finders R, MD   2.5 mg at 04/22/21 1052   aspirin EC tablet 81 mg  81 mg Oral Daily Zada Finders R, MD   81 mg at 04/22/21 1052   benzonatate (TESSALON) capsule 200 mg  200 mg Oral TID PRN Lenore Cordia, MD       dexamethasone (DECADRON) tablet 6 mg  6 mg Oral Q24H Zada Finders R, MD   6 mg at 04/21/21 2118   escitalopram (LEXAPRO) tablet 5 mg  5 mg Oral QHS Lenore Cordia, MD   5 mg at  04/21/21 2118   ezetimibe (ZETIA) tablet 10 mg  10 mg Oral QHS Zada Finders R, MD   10 mg at 04/21/21 2118   famotidine (PEPCID) tablet 20 mg  20 mg Oral QHS PRN Lenore Cordia, MD       heparin injection 5,000 Units  5,000 Units Subcutaneous Q8H Lenore Cordia, MD   5,000 Units at 04/22/21 0500   levothyroxine (SYNTHROID) tablet 75 mcg  75 mcg Oral QAC breakfast Lenore Cordia, MD   75 mcg at 04/22/21 0453   multivitamin with minerals tablet 1 tablet  1 tablet Oral Daily Mariel Aloe, MD   1 tablet at 04/22/21 1051   ondansetron (ZOFRAN) tablet 4 mg  4 mg Oral Q6H PRN Lenore Cordia, MD       Or   ondansetron (ZOFRAN) injection 4 mg  4 mg Intravenous Q6H PRN Lenore Cordia, MD        senna-docusate (Senokot-S) tablet 1 tablet  1 tablet Oral QHS PRN Lenore Cordia, MD         Discharge Medications: Please see discharge summary for a list of discharge medications.  Relevant Imaging Results:  Relevant Lab Results:   Additional Information [place], [place]

## 2021-04-22 NOTE — Progress Notes (Signed)
Occupational Therapy Evaluation  Patient lives home alone with flight of steps to get into her house. Patient reports she has friends that can drop food off for her but nobody that can stay with her due to [place] dx. Currently patient presents with generalized weakness and mild dizziness with standing activity requiring min G for safety. Patient BP in standing 109/66, in sitting 118/57 and O2 stable throughout on room air. Acute OT follow to progress overall activity tolerance and strength in order to safely perform ADL tasks.   04/22/21 1200  OT Visit Information  Last OT Received On 04/22/21  Assistance Needed +1  History of Present Illness 88 yo female admitted with COVID, weakness. Hx of CAD, CABG, CKD, osteoporosis.  Precautions  Precautions Fall  Precaution Comments gets dizziness in standing  Restrictions  Weight Bearing Restrictions No  Home Living  Family/patient expects to be discharged to: Private residence  Living Arrangements Alone  Available Help at Discharge Friend(s);Available PRN/intermittently  Type of Home House  Home Access Stairs to enter  Entrance Stairs-Number of Steps 1 flight  Entrance Stairs-Rails Right  Home Layout Able to live on main level with bedroom/bathroom  Bathroom Shower/Tub Tub/shower unit  Automotive engineer None  Prior Function  Prior Level of Function  Independent/Modified Independent  Communication  Communication No difficulties  Pain Assessment  Pain Assessment Faces  Faces Pain Scale 4  Pain Location neck  Pain Descriptors / Indicators Discomfort;Sore  Pain Intervention(s) Heat applied (warm blanket)  Cognition  Arousal/Alertness Awake/alert  Behavior During Therapy WFL for tasks assessed/performed  Overall Cognitive Status Within Functional Limits for tasks assessed  Upper Extremity Assessment  Upper Extremity Assessment Generalized weakness  Lower Extremity Assessment  Lower Extremity Assessment Defer to PT  evaluation  ADL  Overall ADL's  Needs assistance/impaired  Eating/Feeding Independent  Grooming Oral care;Wash/dry face;Wash/dry hands;Supervision/safety;Standing  Upper Body Bathing Set up;Sitting  Lower Body Bathing Min guard;Sit to/from stand  Upper Body Dressing  Set up;Sitting  Lower Body Dressing Set up;Min guard;Sitting/lateral leans;Sit to/from stand  Lower Body Dressing Details (indicate cue type and reason) seated edge of bed patient able to don socks with increased time  Toilet Transfer Min guard;Ambulation;Rolling walker (2 wheels)  Toilet Transfer Details (indicate cue type and reason) Reports mild dizziness with standing and weakness, min G for safety.  Toileting- Music therapist guard;Sit to/from stand;Sitting/lateral lean  Functional mobility during ADLs Min guard;Rolling walker (2 wheels)  General ADL Comments Patient overall min G assist for dynamic standing/functional transfers for safety due to global weakness and reports of mild dizziness in standing  Bed Mobility  Overal bed mobility Needs Assistance  Bed Mobility Supine to Sit  Supine to sit Supervision;HOB elevated  Balance  Overall balance assessment Needs assistance  Standing balance support No upper extremity supported  Standing balance-Leahy Scale Fair  Standing balance comment Can stand at sink during ADLs without UE support  General Comments  General comments (skin integrity, edema, etc.) BP stand 109/66 O2 96%, sitting 118/57  OT - End of Session  Equipment Utilized During Treatment Rolling walker (2 wheels)  Activity Tolerance Patient tolerated treatment well  Patient left in chair;with call bell/phone within reach  Nurse Communication Mobility status  OT Assessment  OT Recommendation/Assessment Patient needs continued OT Services  OT Visit Diagnosis Unsteadiness on feet (R26.81);Muscle weakness (generalized) (M62.81)  OT Problem List Decreased strength;Decreased activity  tolerance;Impaired balance (sitting and/or standing);Decreased safety awareness  OT Plan  OT  Frequency (ACUTE ONLY) Min 2X/week  OT Treatment/Interventions (ACUTE ONLY) Self-care/ADL training;Therapeutic exercise;Energy conservation;DME and/or AE instruction;Therapeutic activities;Balance training;Patient/family education  AM-PAC OT "6 Clicks" Daily Activity Outcome Measure (Version 2)  Help from another person eating meals? 4  Help from another person taking care of personal grooming? 3  Help from another person toileting, which includes using toliet, bedpan, or urinal? 3  Help from another person bathing (including washing, rinsing, drying)? 3  Help from another person to put on and taking off regular upper body clothing? 3  Help from another person to put on and taking off regular lower body clothing? 3  6 Click Score 19  Progressive Mobility  What is the highest level of mobility based on the progressive mobility assessment? Level 4 (Walks with assist in room) - Balance while marching in place and cannot step forward and back - Complete  Mobility Ambulated with assistance in room;Out of bed to chair with meals  OT Recommendation  Follow Up Recommendations Skilled nursing-short term rehab (<3 hours/day)  Assistance recommended at discharge Intermittent Supervision/Assistance  Patient can return home with the following A little help with walking and/or transfers;A little help with bathing/dressing/bathroom  Functional Status Assessent Patient has had a recent decline in their functional status and demonstrates the ability to make significant improvements in function in a reasonable and predictable amount of time.  OT Equipment Tub/shower seat  Individuals Consulted  Consulted and Agree with Results and Recommendations Patient  Acute Rehab OT Goals  Patient Stated Goal get stronger  OT Goal Formulation With patient  Time For Goal Achievement 05/06/21  Potential to Achieve Goals Good  OT  Time Calculation  OT Start Time (ACUTE ONLY) 0946  OT Stop Time (ACUTE ONLY) 1009  OT Time Calculation (min) 23 min  OT General Charges  $OT Visit 1 Visit  OT Evaluation  $OT Eval Low Complexity 1 Low  OT Treatments  $Self Care/Home Management  8-22 mins  Written Expression  Dominant Hand Right      04/22/21 1200  OT Visit Information  Last OT Received On 04/22/21  Assistance Needed +1  History of Present Illness 88 yo female admitted with COVID, weakness. Hx of CAD, CABG, CKD, osteoporosis.  Precautions  Precautions Fall  Precaution Comments gets dizziness in standing  Restrictions  Weight Bearing Restrictions No  Home Living  Family/patient expects to be discharged to: Private residence  Living Arrangements Alone  Available Help at Discharge Friend(s);Available PRN/intermittently  Type of Home House  Home Access Stairs to enter  Entrance Stairs-Number of Steps 1 flight  Entrance Stairs-Rails Right  Home Layout Able to live on main level with bedroom/bathroom  Bathroom Shower/Tub Tub/shower unit  Automotive engineer None  Prior Function  Prior Level of Function  Independent/Modified Independent  Communication  Communication No difficulties  Pain Assessment  Pain Assessment Faces  Faces Pain Scale 4  Pain Location neck  Pain Descriptors / Indicators Discomfort;Sore  Pain Intervention(s) Heat applied (warm blanket)  Cognition  Arousal/Alertness Awake/alert  Behavior During Therapy WFL for tasks assessed/performed  Overall Cognitive Status Within Functional Limits for tasks assessed  Upper Extremity Assessment  Upper Extremity Assessment Generalized weakness  Lower Extremity Assessment  Lower Extremity Assessment Defer to PT evaluation  ADL  Overall ADL's  Needs assistance/impaired  Eating/Feeding Independent  Grooming Oral care;Wash/dry face;Wash/dry hands;Supervision/safety;Standing  Upper Body Bathing Set up;Sitting  Lower Body  Bathing Min guard;Sit to/from stand  Upper Body Dressing  Set up;Sitting  Lower Body Dressing Set up;Min guard;Sitting/lateral leans;Sit to/from stand  Lower Body Dressing Details (indicate cue type and reason) seated edge of bed patient able to don socks with increased time  Toilet Transfer Min guard;Ambulation;Rolling walker (2 wheels)  Toilet Transfer Details (indicate cue type and reason) Reports mild dizziness with standing and weakness, min G for safety.  Toileting- Music therapist guard;Sit to/from stand;Sitting/lateral lean  Functional mobility during ADLs Min guard;Rolling walker (2 wheels)  General ADL Comments Patient overall min G assist for dynamic standing/functional transfers for safety due to global weakness and reports of mild dizziness in standing  Bed Mobility  Overal bed mobility Needs Assistance  Bed Mobility Supine to Sit  Supine to sit Supervision;HOB elevated  Balance  Overall balance assessment Needs assistance  Standing balance support No upper extremity supported  Standing balance-Leahy Scale Fair  Standing balance comment Can stand at sink during ADLs without UE support  General Comments  General comments (skin integrity, edema, etc.) BP stand 109/66 O2 96%, sitting 118/57  OT - End of Session  Equipment Utilized During Treatment Rolling walker (2 wheels)  Activity Tolerance Patient tolerated treatment well  Patient left in chair;with call bell/phone within reach  Nurse Communication Mobility status  OT Assessment  OT Recommendation/Assessment Patient needs continued OT Services  OT Visit Diagnosis Unsteadiness on feet (R26.81);Muscle weakness (generalized) (M62.81)  OT Problem List Decreased strength;Decreased activity tolerance;Impaired balance (sitting and/or standing);Decreased safety awareness  OT Plan  OT Frequency (ACUTE ONLY) Min 2X/week  OT Treatment/Interventions (ACUTE ONLY) Self-care/ADL training;Therapeutic exercise;Energy  conservation;DME and/or AE instruction;Therapeutic activities;Balance training;Patient/family education  AM-PAC OT "6 Clicks" Daily Activity Outcome Measure (Version 2)  Help from another person eating meals? 4  Help from another person taking care of personal grooming? 3  Help from another person toileting, which includes using toliet, bedpan, or urinal? 3  Help from another person bathing (including washing, rinsing, drying)? 3  Help from another person to put on and taking off regular upper body clothing? 3  Help from another person to put on and taking off regular lower body clothing? 3  6 Click Score 19  Progressive Mobility  What is the highest level of mobility based on the progressive mobility assessment? Level 4 (Walks with assist in room) - Balance while marching in place and cannot step forward and back - Complete  Mobility Ambulated with assistance in room;Out of bed to chair with meals  OT Recommendation  Follow Up Recommendations Skilled nursing-short term rehab (<3 hours/day)  Assistance recommended at discharge Intermittent Supervision/Assistance  Patient can return home with the following A little help with walking and/or transfers;A little help with bathing/dressing/bathroom  Functional Status Assessent Patient has had a recent decline in their functional status and demonstrates the ability to make significant improvements in function in a reasonable and predictable amount of time.  OT Equipment Tub/shower seat  Individuals Consulted  Consulted and Agree with Results and Recommendations Patient  Acute Rehab OT Goals  Patient Stated Goal get stronger  OT Goal Formulation With patient  Time For Goal Achievement 05/06/21  Potential to Achieve Goals Good  OT Time Calculation  OT Start Time (ACUTE ONLY) 0946  OT Stop Time (ACUTE ONLY) 1009  OT Time Calculation (min) 23 min  OT General Charges  $OT Visit 1 Visit  OT Evaluation  $OT Eval Low Complexity 1 Low  OT Treatments   $Self Care/Home Management  8-22 mins  Written Expression  Dominant Hand Right

## 2021-04-22 NOTE — Progress Notes (Signed)
PROGRESS NOTE    [name]  EVO:[id number] DOB: 1932-03-09 DOA: 04/20/2021 PCP: Leonides Sake, MD   Brief Narrative: Jennifer Joyce is a 88 y.o. female with a history of CAD s/p CABG, endocarditis s/p bioprosthetic AVR in 2013, CKD stage IIIb, hypothyroidism, hypertension, hyperlipidemia, depression/anxiety, hard of hearing. Patient presented secondary to shortness of breath and found to have multifocal infiltrates on CT imaging with concern for possible community acquired pneumonia and/or post-covid inflammation. Patient started on steroids and antibiotics.  04/22/2021: Patient was seen and examined at bedside.  Reports feeling very weak.  Seen by PT and recommendation for SNF.  Patient declined SNF.  She wants to go to her home, get stronger prior to DC.  Assessment & Plan:   * COVID-19 virus infection- (present on admission) Previously treated with for COVID-19. Continues to have a positive test. Started on steroids this admission secondary to possible worsened pulmonology infiltrates. CRP low at 1. -Continue Decadron  Multilobar lung infiltrate Acute component on chronic component. Recent COVID-19 viral infection. Possible post-COVID inflammation vs secondary pneumonia. Started on Ceftriaxone and azithromycin. Procalcitonin undetectable. No leukocytosis. -Check RVP -Discontinue antibiotics  Depression with anxiety- (present on admission) -Continue Lexapro  Hyperlipidemia- (present on admission) -Continue Zetia  Hypothyroidism- (present on admission) -Continue Synthroid  Essential hypertension- (present on admission) Patient is on losartan, amlodipine as an outpatient. Losartan held secondary to soft blood pressure. -Continue amlodipine and Lipitor  Generalized weakness PT/OT consulted. Recommendation for SNF.  Hyperkalemia- (present on admission) Possibly related to CKD. Received Lokelma 5 g x1 on admission. Potassium slightly worsened. -Lokelma  Stage 3b  chronic kidney disease (CKD) ([place])- (present on admission) Baseline creatinine is appears to be about 1.4. Stable.  CAD (coronary artery disease)- (present on admission) -Continue aspirin  S/P AVR (aortic valve replacement) Noted.    Hyperkalemia Serum potassium 5.4 Received Lokelma Repeat BMP  Non anion gap metabolic acidosis likely secondary to renal insufficiency Serum bicarb 19 Creatinine uptrending Closely monitor renal function and urine output Avoid nephrotoxic agents Encourage oral intake Repeat BMP in the morning  Physical debility PT OT assessed and recommended SNF Patient declined SNF she wants to go home Wants to get stronger prior to DC.    DVT prophylaxis: Heparin subq Code Status:   Code Status: Full Code Family Communication: None at bedside Disposition Plan: Discharge to SNF when bed is available. Medically stable for discharge.   Consultants:  None  Procedures:  None  Antimicrobials: Ceftriaxone IV Azithromycin    Subjective: No dyspnea. On room air.  Objective: Vitals:   04/21/21 2000 04/21/21 2023 04/22/21 0422 04/22/21 1142  BP: 100/64 (!) 97/49 123/69 (!) 134/59  Pulse: 89 86 70 80  Resp: 18 18 18 18   Temp: 98.6 F (37 C) 98.8 F (37.1 C) 98 F (36.7 C) 99.7 F (37.6 C)  TempSrc: Oral Oral Oral Oral  SpO2: 98% 100% 98% 100%  Weight:   74.1 kg     Intake/Output Summary (Last 24 hours) at 04/22/2021 1357 Last data filed at 04/22/2021 0900 Gross per 24 hour  Intake 640 ml  Output 1350 ml  Net -710 ml   Filed Weights   04/22/21 0422  Weight: 74.1 kg    Examination:  General exam: Well-developed well-nourished no acute stress.  She is alert oriented x3. Respiratory system: Clear to auscultation no wheezes or rales.   Cardiovascular system: Regular rate and rhythm no rubs or gallops.   Gastrointestinal system: Soft monitor normal bowel sounds present.  Central nervous system: Alert oriented x3.  Nonfocal exam.    Musculoskeletal: No lower extremity edema bilaterally.   Skin: No rashes or ulcerative lesions noted. Psychiatry:     Data Reviewed: I have personally reviewed following labs and imaging studies  CBC Lab Results  Component Value Date   WBC 8.1 04/21/2021   RBC 3.53 (L) 04/21/2021   HGB 10.3 (L) 04/21/2021   HCT 33.5 (L) 04/21/2021   MCV 94.9 04/21/2021   MCH 29.2 04/21/2021   PLT 490 (H) 04/21/2021   MCHC 30.7 04/21/2021   RDW 13.6 04/21/2021   LYMPHSABS 0.4 (L) 04/21/2021   MONOABS 0.1 04/21/2021   EOSABS 0.0 04/21/2021   BASOSABS 0.0 16/01/9603     Last metabolic panel Lab Results  Component Value Date   NA 135 04/22/2021   K 5.4 (H) 04/22/2021   CL 107 04/22/2021   CO2 19 (L) 04/22/2021   BUN 31 (H) 04/22/2021   CREATININE 1.56 (H) 04/22/2021   GLUCOSE 139 (H) 04/22/2021   GFRNONAA 32 (L) 04/22/2021   CALCIUM 8.9 04/22/2021   PHOS 4.9 (H) 04/21/2021   PROT 7.0 04/21/2021   ALBUMIN 2.8 (L) 04/21/2021   BILITOT 0.4 04/21/2021   ALKPHOS 58 04/21/2021   AST 16 04/21/2021   ALT 15 04/21/2021   ANIONGAP 9 04/22/2021    CBG (last 3)  No results for input(s): GLUCAP in the last 72 hours.   GFR: CrCl cannot be calculated (Unknown ideal weight.).  Coagulation Profile: No results for input(s): INR, PROTIME in the last 168 hours.  Recent Results (from the past 240 hour(s))  Resp Panel by RT-PCR (Flu A&B, Covid) Nasopharyngeal Swab     Status: Abnormal   Collection Time: 04/20/21  5:01 PM   Specimen: Nasopharyngeal Swab; Nasopharyngeal(NP) swabs in vial transport medium  Result Value Ref Range Status   SARS Coronavirus 2 by RT PCR POSITIVE (A) NEGATIVE Final    Comment: (NOTE) SARS-CoV-2 target nucleic acids are DETECTED.  The SARS-CoV-2 RNA is generally detectable in upper respiratory specimens during the acute phase of infection. Positive results are indicative of the presence of the identified virus, but do not rule out bacterial infection or  co-infection with other pathogens not detected by the test. Clinical correlation with patient history and other diagnostic information is necessary to determine patient infection status. The expected result is Negative.  Fact Sheet for Patients: EntrepreneurPulse.com.au  Fact Sheet for Healthcare Providers: IncredibleEmployment.be  This test is not yet approved or cleared by the [place] FDA and  has been authorized for detection and/or diagnosis of SARS-CoV-2 by FDA under an Emergency Use Authorization (EUA).  This EUA will remain in effect (meaning this test can be used) for the duration of  the COVID-19 declaration under Section 564(b)(1) of the A ct, 21 U.S.C. section 360bbb-3(b)(1), unless the authorization is terminated or revoked sooner.     Influenza A by PCR NEGATIVE NEGATIVE Final   Influenza B by PCR NEGATIVE NEGATIVE Final    Comment: (NOTE) The Xpert Xpress SARS-CoV-2/FLU/RSV plus assay is intended as an aid in the diagnosis of influenza from Nasopharyngeal swab specimens and should not be used as a sole basis for treatment. Nasal washings and aspirates are unacceptable for Xpert Xpress SARS-CoV-2/FLU/RSV testing.  Fact Sheet for Patients: EntrepreneurPulse.com.au  Fact Sheet for Healthcare Providers: IncredibleEmployment.be  This test is not yet approved or cleared by the [place] FDA and has been authorized for detection and/or diagnosis of SARS-CoV-2 by FDA under  an Emergency Use Authorization (EUA). This EUA will remain in effect (meaning this test can be used) for the duration of the COVID-19 declaration under Section 564(b)(1) of the Act, 21 U.S.C. section 360bbb-3(b)(1), unless the authorization is terminated or revoked.  Performed at [facility], [place] [address] [place], [place] [address]   Respiratory (~20 pathogens) panel by PCR     Status: None    Collection Time: 04/21/21  4:59 PM   Specimen: Nasopharyngeal Swab; Respiratory  Result Value Ref Range Status   Adenovirus NOT DETECTED NOT DETECTED Final   Coronavirus 229E NOT DETECTED NOT DETECTED Final    Comment: (NOTE) The Coronavirus on the Respiratory Panel, DOES NOT test for the novel  Coronavirus (2019 nCoV)    Coronavirus HKU1 NOT DETECTED NOT DETECTED Final   Coronavirus NL63 NOT DETECTED NOT DETECTED Final   Coronavirus OC43 NOT DETECTED NOT DETECTED Final   Metapneumovirus NOT DETECTED NOT DETECTED Final   Rhinovirus / Enterovirus NOT DETECTED NOT DETECTED Final   Influenza A NOT DETECTED NOT DETECTED Final   Influenza B NOT DETECTED NOT DETECTED Final   Parainfluenza Virus 1 NOT DETECTED NOT DETECTED Final   Parainfluenza Virus 2 NOT DETECTED NOT DETECTED Final   Parainfluenza Virus 3 NOT DETECTED NOT DETECTED Final   Parainfluenza Virus 4 NOT DETECTED NOT DETECTED Final   Respiratory Syncytial Virus NOT DETECTED NOT DETECTED Final   Bordetella pertussis NOT DETECTED NOT DETECTED Final   Bordetella Parapertussis NOT DETECTED NOT DETECTED Final   Chlamydophila pneumoniae NOT DETECTED NOT DETECTED Final   Mycoplasma pneumoniae NOT DETECTED NOT DETECTED Final    Comment: Performed at [facility], [place]. [address] [place], [place] [address]        Radiology Studies: CT Angio Chest PE W and/or Wo Contrast  Result Date: 04/20/2021 CLINICAL DATA:  Shortness of breath on exertion with chills. Recent COVID-19 infection. Clinical concern for pulmonary embolism. EXAM: CT ANGIOGRAPHY CHEST WITH CONTRAST TECHNIQUE: Multidetector CT imaging of the chest was performed using the standard protocol during bolus administration of intravenous contrast. Multiplanar CT image reconstructions and MIPs were obtained to evaluate the vascular anatomy. CONTRAST:  67mL OMNIPAQUE IOHEXOL 350 MG/ML SOLN COMPARISON:  Radiographs 04/20/2021. CT 11/10/2002 (without report). FINDINGS:  Cardiovascular: The pulmonary arteries are well opacified with contrast to the level of the subsegmental branches. There is no evidence of acute pulmonary embolism. Diffuse atherosclerosis of the aorta, great vessels and coronary arteries status post median sternotomy, aortic valve replacement and probable CABG. The heart size is normal. There is no pericardial effusion. Mediastinum/Nodes: There are no enlarged mediastinal, hilar or axillary lymph nodes.Small mediastinal lymph nodes appear unchanged. There is a small hiatal hernia. The thyroid gland and trachea demonstrate no significant findings. Lungs/Pleura: No pleural effusion or pneumothorax. There is some pleural thickening on the left. Evidence of chronic lung disease with emphysema, subpleural reticulation, mild traction bronchiectasis and multiple nodular airspace opacities in both lungs, similar to remote prior CT, but mildly progressive. Most of these are subpleural in location, although there is an intraparenchymal component at the right apex measuring up to 1.6 cm on image 23/6. Upper abdomen: No acute findings are seen within the visualized upper abdomen. There is a small cyst in the left hepatic lobe. Both kidneys demonstrate mild cortical thinning. Musculoskeletal/Chest wall: There is no chest wall mass or suspicious osseous finding. Previous median sternotomy. Prominent thoracic kyphosis. Review of the MIP images confirms the above findings. IMPRESSION: 1. No  evidence of acute pulmonary embolism or other acute vascular findings in the chest. 2. Multifocal, predominantly peripheral airspace opacities in both lungs are similar to remote CT from 2004, but mildly progressive. Findings could relate to chronic atypical inflammation such as organizing pneumonia or vasculitis. Given the progression and recent COVID-19 infection, superimposed pneumonia not excluded. Recommend at least chest radiographic follow-up in 4-6 weeks. Chest CT follow-up may be  warranted. 3. No adenopathy or significant pleural effusion. 4. Coronary and Aortic Atherosclerosis (ICD10-I70.0). Post AVR and probable CABG. Electronically Signed   By: Richardean Sale M.D.   On: 04/20/2021 16:21   US RENAL  Result Date: 04/20/2021 CLINICAL DATA:  Left flank pain. EXAM: RENAL / URINARY TRACT ULTRASOUND COMPLETE COMPARISON:  Renal ultrasound dated 10/13/2011. FINDINGS: Right Kidney: Renal measurements: 9.1 x 5.3 x 5.0 cm = volume: 126 mL. There is increased echogenicity and parenchyma atrophy and cortical thinning. Moderate pelviectasis. No shadowing stone. Left Kidney: Renal measurements: 8.8 x 4.2 x 5.0 cm = volume: 97 mL. Increased echogenicity with parenchyma atrophy. No hydronephrosis or shadowing stone. Bladder: Appears normal for degree of bladder distention. Other: None. IMPRESSION: 1. Atrophic and echogenic kidneys in keeping with chronic kidney disease. 2. Moderate right pelviectasis.  No shadowing stone. Electronically Signed   By: Anner Crete M.D.   On: 04/20/2021 21:48     Scheduled Meds:  albuterol  2 puff Inhalation Q6H   amLODipine  2.5 mg Oral Daily   aspirin EC  81 mg Oral Daily   dexamethasone  6 mg Oral Q24H   escitalopram  5 mg Oral QHS   ezetimibe  10 mg Oral QHS   heparin  5,000 Units Subcutaneous Q8H   levothyroxine  75 mcg Oral QAC breakfast   multivitamin with minerals  1 tablet Oral Daily   Continuous Infusions:     LOS: 1 day     Cordelia Poche, MD [facility] 04/22/2021, 1:57 PM  If 7PM-7AM, please contact night-coverage [email or web address]

## 2021-04-22 NOTE — Progress Notes (Signed)
Nutrition Brief Note  Patient identified on the Malnutrition Screening Tool (MST) Report  Patient reports improved appetite and now eating 100% of meals. Has MVI ordered. Weight on 11/12/20 was 161 lbs.  Current weight: 163 lbs. -stable  Wt Readings from Last 15 Encounters:  04/22/21 74.1 kg  05/19/11 77.1 kg  03/30/11 77.7 kg  02/25/11 76.1 kg    Body mass index is 27.18 kg/m. Patient meets criteria for overweight based on current BMI.   Current diet order is heart healthy, patient is consuming approximately 100% of meals at this time. Labs and medications reviewed.   No nutrition interventions warranted at this time. If nutrition issues arise, please consult RD.   Clayton Bibles, MS, RD, LDN Inpatient Clinical Dietitian Contact information available via Amion

## 2021-04-23 LAB — BASIC METABOLIC PANEL
Anion gap: 7 (ref 5–15)
BUN: 35 mg/dL — ABNORMAL HIGH (ref 8–23)
CO2: 19 mmol/L — ABNORMAL LOW (ref 22–32)
Calcium: 8.7 mg/dL — ABNORMAL LOW (ref 8.9–10.3)
Chloride: 107 mmol/L (ref 98–111)
Creatinine, Ser: 1.83 mg/dL — ABNORMAL HIGH (ref 0.44–1.00)
GFR, Estimated: 26 mL/min — ABNORMAL LOW (ref >60)
Glucose, Bld: 149 mg/dL — ABNORMAL HIGH (ref 70–99)
Potassium: 5 mmol/L (ref 3.5–5.1)
Sodium: 133 mmol/L — ABNORMAL LOW (ref 135–145)

## 2021-04-23 MED ORDER — SODIUM CHLORIDE 0.9 % IV SOLN: INTRAVENOUS | Status: DC

## 2021-04-23 MED ORDER — SODIUM BICARBONATE 650 MG PO TABS
1300.0000 mg | ORAL_TABLET | Freq: Four times a day (QID) | ORAL | Status: DC
  Administered 2021-04-23 – 2021-04-24 (×3): 1300 mg via ORAL
  Filled 2021-04-23 (×3): qty 2

## 2021-04-23 NOTE — TOC Progression Note (Signed)
Transition of Care [facility]) - Progression Note   Patient Details  Name: Jennifer Joyce MRN: [id number] Date of Birth: [birth year]  Transition of Care [facility]) CM/SW Cobb, LCSW Phone Number: 04/23/2021, 12:51 PM  Clinical Narrative: CSW spoke with patient's niece, Adair Patter, regarding SNF. CSW explained that since the patient tested positive for COVID on 04/20/21, most facilities will not accept her for rehab until 11 days after the initial positive test. CSW followed up with Claiborne Billings with [facility] and [place] with [facility] as both facilities made bed offers; neither can accept the patient until 04/30/21. CSW updated niece as the patient may rehab enough to go home with HHPT before the 11th day. CSW updated hospitalist.  Expected Discharge Plan: Skilled Nursing Facility Barriers to Discharge: SNF Pending bed offer  Expected Discharge Plan and Services Expected Discharge Plan: [place] Discharge Planning Services: CM Consult Post Acute Care Choice: [place] arrangements for the past 2 months: Single Family Home  Readmission Risk Interventions No flowsheet data found.

## 2021-04-23 NOTE — Progress Notes (Signed)
PROGRESS NOTE    Jennifer Joyce  XBD:[id number] DOB: [birth year] DOA: 04/20/2021 PCP: Leonides Sake, MD   Brief Narrative: Jennifer Joyce is a 88 y.o. female with a history of CAD s/p CABG, endocarditis s/p bioprosthetic AVR in 2013, CKD stage IIIb, hypothyroidism, hypertension, hyperlipidemia, depression/anxiety, hard of hearing. Patient presented secondary to shortness of breath and found to have multifocal infiltrates on CT imaging with concern for possible community acquired pneumonia and/or post-covid inflammation. Patient started on steroids and antibiotics.  04/23/2021: Patient was seen and examined at bedside.  Main complaint is of generalized weakness.  She is receptive to SNF placement.  SNF requiring 10-day isolation to be completed.  Assessment & Plan:   * COVID-19 virus infection- (present on admission) Previously treated with for COVID-19. Continues to have a positive test. Started on steroids this admission secondary to possible worsened pulmonology infiltrates. CRP low at 1. -Continue Decadron  Multilobar lung infiltrate Acute component on chronic component. Recent COVID-19 viral infection. Possible post-COVID inflammation vs secondary pneumonia. Started on Ceftriaxone and azithromycin. Procalcitonin undetectable. No leukocytosis. -Check RVP -Discontinue antibiotics  Depression with anxiety- (present on admission) -Continue Lexapro  Hyperlipidemia- (present on admission) -Continue Zetia  Hypothyroidism- (present on admission) -Continue Synthroid  Essential hypertension- (present on admission) Patient is on losartan, amlodipine as an outpatient. Losartan held secondary to soft blood pressure. -Continue amlodipine and Lipitor  Generalized weakness PT/OT consulted. Recommendation for SNF.  Hyperkalemia- (present on admission) Possibly related to CKD. Received Lokelma 5 g x1 on admission. Potassium slightly worsened. -Lokelma  Stage 3b chronic kidney disease  (CKD) ([place])- (present on admission) Baseline creatinine is appears to be about 1.4. Stable.  CAD (coronary artery disease)- (present on admission) -Continue aspirin  S/P AVR (aortic valve replacement) Noted.    Resolved hyperkalemia Serum potassium 5.4> 5.0. Received Lokelma Repeat BMP  Non anion gap metabolic acidosis likely secondary to renal insufficiency Serum bicarb 19 Add p.o. sodium bicarb 4 times daily x2 days.  AKI on CKD 3B, prerenal secondary to dehydration. Creatinine is uptrending Started gentle IV fluid hydration normal saline at 50 cc/h Patient reports poor oral fluid intake Closely monitor renal function and urine output Continue to avoid nephrotoxic agents Encourage oral intake Repeat BMP in the morning  Physical debility PT OT assessed and recommended SNF Patient is receptive to SNF placement. SNF requiring 10 days of isolation to be completed.    DVT prophylaxis: Heparin subq Code Status:   Code Status: Full Code Family Communication: None at bedside Disposition Plan: Discharge to SNF when bed is available. Medically stable for discharge.   Consultants:  None  Procedures:  None  Antimicrobials: Ceftriaxone IV Azithromycin    Subjective: No dyspnea. On room air.  Objective: Vitals:   04/22/21 1142 04/22/21 2141 04/23/21 0601 04/23/21 1516  BP: (!) 134/59 (!) 139/56 (!) 152/77 (!) 103/54  Pulse: 80 74 71 71  Resp: 18 (!) 24 19 16   Temp: 99.7 F (37.6 C) 97.8 F (36.6 C) 98.3 F (36.8 C) 98.3 F (36.8 C)  TempSrc: Oral Oral    SpO2: 100% 96% 99% 99%  Weight:        Intake/Output Summary (Last 24 hours) at 04/23/2021 1545 Last data filed at 04/23/2021 1224 Gross per 24 hour  Intake 1302 ml  Output --  Net 1302 ml   Filed Weights   04/22/21 0422  Weight: 74.1 kg    Examination:  General exam: Frail-appearing in no acute distress.  She is alert oriented  x3.   Respiratory system: Clear to auscultation with no wheezes or  rales.   Cardiovascular system: Regular rate and rhythm no rubs or gallops.   Gastrointestinal system: Soft monitor normal bowel sounds present.   Central nervous system: Alert and oriented x3.   Musculoskeletal: No lower extremity edema bilaterally. Skin: No rashes or ulcerative lesions. Psychiatry: Mood is appropriate for condition and setting.    Data Reviewed: I have personally reviewed following labs and imaging studies  CBC Lab Results  Component Value Date   WBC 8.1 04/21/2021   RBC 3.53 (L) 04/21/2021   HGB 10.3 (L) 04/21/2021   HCT 33.5 (L) 04/21/2021   MCV 94.9 04/21/2021   MCH 29.2 04/21/2021   PLT 490 (H) 04/21/2021   MCHC 30.7 04/21/2021   RDW 13.6 04/21/2021   LYMPHSABS 0.4 (L) 04/21/2021   MONOABS 0.1 04/21/2021   EOSABS 0.0 04/21/2021   BASOSABS 0.0 35/57/3220     Last metabolic panel Lab Results  Component Value Date   NA 133 (L) 04/23/2021   K 5.0 04/23/2021   CL 107 04/23/2021   CO2 19 (L) 04/23/2021   BUN 35 (H) 04/23/2021   CREATININE 1.83 (H) 04/23/2021   GLUCOSE 149 (H) 04/23/2021   GFRNONAA 26 (L) 04/23/2021   CALCIUM 8.7 (L) 04/23/2021   PHOS 4.9 (H) 04/21/2021   PROT 7.0 04/21/2021   ALBUMIN 2.8 (L) 04/21/2021   BILITOT 0.4 04/21/2021   ALKPHOS 58 04/21/2021   AST 16 04/21/2021   ALT 15 04/21/2021   ANIONGAP 7 04/23/2021    CBG (last 3)  No results for input(s): GLUCAP in the last 72 hours.   GFR: CrCl cannot be calculated (Unknown ideal weight.).  Coagulation Profile: No results for input(s): INR, PROTIME in the last 168 hours.  Recent Results (from the past 240 hour(s))  Resp Panel by RT-PCR (Flu A&B, Covid) Nasopharyngeal Swab     Status: Abnormal   Collection Time: 04/20/21  5:01 PM   Specimen: Nasopharyngeal Swab; Nasopharyngeal(NP) swabs in vial transport medium  Result Value Ref Range Status   SARS Coronavirus 2 by RT PCR POSITIVE (A) NEGATIVE Final    Comment: (NOTE) SARS-CoV-2 target nucleic acids are  DETECTED.  The SARS-CoV-2 RNA is generally detectable in upper respiratory specimens during the acute phase of infection. Positive results are indicative of the presence of the identified virus, but do not rule out bacterial infection or co-infection with other pathogens not detected by the test. Clinical correlation with patient history and other diagnostic information is necessary to determine patient infection status. The expected result is Negative.  Fact Sheet for Patients: EntrepreneurPulse.com.au  Fact Sheet for Healthcare Providers: IncredibleEmployment.be  This test is not yet approved or cleared by the [place] FDA and  has been authorized for detection and/or diagnosis of SARS-CoV-2 by FDA under an Emergency Use Authorization (EUA).  This EUA will remain in effect (meaning this test can be used) for the duration of  the COVID-19 declaration under Section 564(b)(1) of the A ct, 21 U.S.C. section 360bbb-3(b)(1), unless the authorization is terminated or revoked sooner.     Influenza A by PCR NEGATIVE NEGATIVE Final   Influenza B by PCR NEGATIVE NEGATIVE Final    Comment: (NOTE) The Xpert Xpress SARS-CoV-2/FLU/RSV plus assay is intended as an aid in the diagnosis of influenza from Nasopharyngeal swab specimens and should not be used as a sole basis for treatment. Nasal washings and aspirates are unacceptable for Xpert Xpress SARS-CoV-2/FLU/RSV testing.  Fact Sheet for Patients: EntrepreneurPulse.com.au  Fact Sheet for Healthcare Providers: IncredibleEmployment.be  This test is not yet approved or cleared by the [place] FDA and has been authorized for detection and/or diagnosis of SARS-CoV-2 by FDA under an Emergency Use Authorization (EUA). This EUA will remain in effect (meaning this test can be used) for the duration of the COVID-19 declaration under Section 564(b)(1) of the Act, 21  U.S.C. section 360bbb-3(b)(1), unless the authorization is terminated or revoked.  Performed at [facility], [place] [address], [place], [place] [address]   Respiratory (~20 pathogens) panel by PCR     Status: None   Collection Time: 04/21/21  4:59 PM   Specimen: Nasopharyngeal Swab; Respiratory  Result Value Ref Range Status   Adenovirus NOT DETECTED NOT DETECTED Final   Coronavirus 229E NOT DETECTED NOT DETECTED Final    Comment: (NOTE) The Coronavirus on the Respiratory Panel, DOES NOT test for the novel  Coronavirus (2019 nCoV)    Coronavirus HKU1 NOT DETECTED NOT DETECTED Final   Coronavirus NL63 NOT DETECTED NOT DETECTED Final   Coronavirus OC43 NOT DETECTED NOT DETECTED Final   Metapneumovirus NOT DETECTED NOT DETECTED Final   Rhinovirus / Enterovirus NOT DETECTED NOT DETECTED Final   Influenza A NOT DETECTED NOT DETECTED Final   Influenza B NOT DETECTED NOT DETECTED Final   Parainfluenza Virus 1 NOT DETECTED NOT DETECTED Final   Parainfluenza Virus 2 NOT DETECTED NOT DETECTED Final   Parainfluenza Virus 3 NOT DETECTED NOT DETECTED Final   Parainfluenza Virus 4 NOT DETECTED NOT DETECTED Final   Respiratory Syncytial Virus NOT DETECTED NOT DETECTED Final   Bordetella pertussis NOT DETECTED NOT DETECTED Final   Bordetella Parapertussis NOT DETECTED NOT DETECTED Final   Chlamydophila pneumoniae NOT DETECTED NOT DETECTED Final   Mycoplasma pneumoniae NOT DETECTED NOT DETECTED Final    Comment: Performed at [facility], [place]. [address], [place], [place] [address]        Radiology Studies: No results found.   Scheduled Meds:  albuterol  2 puff Inhalation TID   amLODipine  2.5 mg Oral Daily   aspirin EC  81 mg Oral Daily   escitalopram  5 mg Oral QHS   ezetimibe  10 mg Oral QHS   heparin  5,000 Units Subcutaneous Q8H   levothyroxine  75 mcg Oral QAC breakfast   multivitamin with minerals  1 tablet Oral Daily   Continuous Infusions:  sodium  chloride 50 mL/hr at 04/23/21 1017      LOS: 2 days     Cordelia Poche, MD [facility] 04/23/2021, 3:45 PM  If 7PM-7AM, please contact night-coverage [email or web address]

## 2021-04-23 NOTE — Progress Notes (Signed)
°   04/23/21 1200  Mobility  Activity Ambulated in hall  Level of Assistance Standby assist, set-up cues, supervision of patient - no hands on  Assistive Device Front wheel walker  Distance Ambulated (ft) 160 ft  Mobility Ambulated with assistance in hallway  Mobility Response Tolerated well  Mobility performed by Mobility specialist  $Mobility charge 1 Mobility   Pt agreeable to mobilize this morning. Prior to session, she stated she was feeling "wobbly and weak" today. Pt does not usually use walker, but used for today's session to ensure safety. Ambulated about 124ft in hall with RW, tolerated well. No complaints. Left pt in bed with call bell at side.   [place] Specialist Acute Rehab Services Office: [phone]

## 2021-04-23 NOTE — Progress Notes (Signed)
Pt stated that she took at home COVID test on 04/06/2021. IP RN notified Baxter Flattery). MD [place] Crane notified.

## 2021-04-24 LAB — BASIC METABOLIC PANEL
Anion gap: 10 (ref 5–15)
BUN: 37 mg/dL — ABNORMAL HIGH (ref 8–23)
CO2: 22 mmol/L (ref 22–32)
Calcium: 8.3 mg/dL — ABNORMAL LOW (ref 8.9–10.3)
Chloride: 104 mmol/L (ref 98–111)
Creatinine, Ser: 1.45 mg/dL — ABNORMAL HIGH (ref 0.44–1.00)
GFR, Estimated: 35 mL/min — ABNORMAL LOW (ref >60)
Glucose, Bld: 88 mg/dL (ref 70–99)
Potassium: 4.1 mmol/L (ref 3.5–5.1)
Sodium: 136 mmol/L (ref 135–145)

## 2021-04-24 MED ORDER — POLYETHYLENE GLYCOL 3350 17 G PO PACK: 17.0000 g | PACK | Freq: Every day | ORAL | 0 refills | Status: AC | PRN

## 2021-04-24 MED ORDER — ALBUTEROL SULFATE HFA 108 (90 BASE) MCG/ACT IN AERS: 2.0000 | INHALATION_SPRAY | Freq: Four times a day (QID) | RESPIRATORY_TRACT | 0 refills | Status: AC | PRN

## 2021-04-24 NOTE — Discharge Summary (Signed)
Discharge Summary  Jennifer Joyce ZLD:[id number] DOB: 11-12-1931  PCP: Leonides Sake, MD  Admit date: 04/20/2021 Discharge date: 04/24/2021  Time spent: 35 minutes.  Recommendations for Outpatient Follow-up:  Follow-up with your PCP Follow-up with cardiology Take your medications as prescribed Continue PT OT with assistance and fall precautions.  Stable  Discharge Diagnoses:  Active Hospital Problems   Diagnosis Date Noted   COVID-19 virus infection 04/20/2021   Multilobar lung infiltrate 04/21/2021   S/P AVR (aortic valve replacement) 04/20/2021   CAD (coronary artery disease) 04/20/2021   Stage 3b chronic kidney disease (CKD) ([place]) 04/20/2021   Hyperkalemia 04/20/2021   Generalized weakness 04/20/2021   Essential hypertension 04/20/2021   Hypothyroidism    Hyperlipidemia    Depression with anxiety     Resolved Hospital Problems  No resolved problems to display.    Discharge Condition: Stable  Diet recommendation: Resume previous diet  Vitals:   04/24/21 0533 04/24/21 1111  BP: 127/69 127/69  Pulse: 63   Resp: 18   Temp: 98.1 F (36.7 C)   SpO2: 95%     History of present illness:   Jennifer Joyce is a 88 y.o. female with a history of CAD s/p CABG, endocarditis s/p bioprosthetic AVR in 2013, CKD stage IIIb, hypothyroidism, hypertension, hyperlipidemia, depression/anxiety, hard of hearing. Patient presented secondary to shortness of breath and found to have multifocal infiltrates on CT imaging with concern for possible community acquired pneumonia and/or post-covid inflammation. Patient started on steroids and antibiotics.   04/24/2021: Patient was seen at her bedside.  No new complaints.  She was reassessed by PT with recommendation for home health PT OT.  Hospital Course:  Principal Problem:   COVID-19 virus infection Active Problems:   S/P AVR (aortic valve replacement)   CAD (coronary artery disease)   Stage 3b chronic kidney disease (CKD) ([facility])    Hyperkalemia   Generalized weakness   Essential hypertension   Hypothyroidism   Hyperlipidemia   Depression with anxiety   Multilobar lung infiltrate  COVID-19 virus infection- (present on admission) Previously treated with for COVID-19. Continues to have a positive test.  Bronchodilators as needed Antitussives as needed   Multilobar lung infiltrate Acute component on chronic component. Recent COVID-19 viral infection. Possible post-COVID inflammation vs secondary pneumonia.  Received ceftriaxone and azithromycin briefly.    Depression with anxiety- (present on admission) -Continue Lexapro   Hyperlipidemia- (present on admission) -Continue Zetia   Hypothyroidism- (present on admission) -Continue Synthroid   Essential hypertension- (present on admission) Patient is on losartan, amlodipine as an outpatient. Losartan held secondary to soft blood pressure. -Continue amlodipine and Lipitor   Generalized weakness PT/OT consulted.  Initially recommendation for SNF. PT reassessment on 04/24/2021, recommended home health PT OT.   Resolved hyperkalemia- (present on admission) Secondary to acute renal insufficiency.  Received Lokelma and gentle IV fluid hydration.  Resolved prerenal AKI on stage 3b chronic kidney disease (CKD) ([place])- (present on admission) Baseline creatinine is 1.4. Stable. Currently back to her baseline creatinine 1.4 with GFR 35. Creatinine peaked at 1.83 with GFR of 26.   CAD (coronary artery disease)- (present on admission) -Continue aspirin and lipitor Follow-up with cardiology   S/P AVR (aortic valve replacement) Noted.  Resolved Non anion gap metabolic acidosis likely secondary to renal insufficiency Serum bicarb 19>> 22     Code Status:   Code Status: Full Code  Consultants:  None   Procedures:  None   Antimicrobials: Ceftriaxone IV Azithromycin  Discharge Exam: BP 127/69    Pulse 63    Temp 98.1 F (36.7 C) (Oral)    Resp 18     Wt 67.6 kg    SpO2 95%    BMI 24.80 kg/m  General: 88 y.o. year-old female well developed well nourished in no acute distress.  Alert and oriented x3. Cardiovascular: Regular rate and rhythm with no rubs or gallops.  No thyromegaly or JVD noted.   Respiratory: Clear to auscultation with no wheezes or rales. Good inspiratory effort. Abdomen: Soft nontender nondistended with normal bowel sounds x4 quadrants. Musculoskeletal: No lower extremity edema. 2/4 pulses in all 4 extremities. Skin: No ulcerative lesions noted or rashes, Psychiatry: Mood is appropriate for condition and setting  Discharge Instructions You were cared for by a hospitalist during your hospital stay. If you have any questions about your discharge medications or the care you received while you were in the hospital after you are discharged, you can call the unit and asked to speak with the hospitalist on call if the hospitalist that took care of you is not available. Once you are discharged, your primary care physician will handle any further medical issues. Please note that NO REFILLS for any discharge medications will be authorized once you are discharged, as it is imperative that you return to your primary care physician (or establish a relationship with a primary care physician if you do not have one) for your aftercare needs so that they can reassess your need for medications and monitor your lab values.   Allergies as of 04/24/2021       Reactions   Tape Other (See Comments)   TAPE CAN TEAR THE SKIN- FRAGILE!!   Atorvastatin Other (See Comments)   Made the patient's muscles hurt   Erythromycin Other (See Comments)   "Made me sick"- GI upset   Erythromycin Base Other (See Comments)   GI upset   Other Nausea And Vomiting, Other (See Comments)   "Oysters- nausea & vomiting noted, 04/29"   Sulfamethoxazole Nausea And Vomiting   Codeine Nausea And Vomiting, Other (See Comments)   GI upset   Oyster Extract Nausea And  Vomiting   Sulfa Antibiotics Nausea And Vomiting        Medication List     STOP taking these medications    losartan 25 MG tablet Commonly known as: COZAAR       TAKE these medications    albuterol 108 (90 Base) MCG/ACT inhaler Commonly known as: VENTOLIN HFA Inhale 2 puffs into the lungs every 6 (six) hours as needed for wheezing or shortness of breath.   amLODipine 2.5 MG tablet Commonly known as: NORVASC Take 2.5 mg by mouth daily.   aspirin EC 81 MG tablet Take 81 mg by mouth daily. Swallow whole.   atorvastatin 10 MG tablet Commonly known as: LIPITOR Take 5 mg by mouth at bedtime.   benzonatate 200 MG capsule Commonly known as: TESSALON Take 200 mg by mouth 3 (three) times daily as needed for cough.   calcium citrate-vitamin D 315-200 MG-UNIT tablet Commonly known as: CITRACAL+D Take 1 tablet by mouth 2 (two) times daily.   denosumab 60 MG/ML Sosy injection Commonly known as: PROLIA Inject 60 mg into the skin every 6 (six) months.   escitalopram 5 MG tablet Commonly known as: LEXAPRO Take 5 mg by mouth at bedtime.   ezetimibe 10 MG tablet Commonly known as: ZETIA Take 10 mg by mouth at bedtime.   famotidine 20 MG  tablet Commonly known as: PEPCID Take 20 mg by mouth at bedtime as needed for indigestion or heartburn.   levothyroxine 75 MCG tablet Commonly known as: SYNTHROID Take 75 mcg by mouth daily before breakfast.   nitroGLYCERIN 0.4 MG SL tablet Commonly known as: NITROSTAT Place 0.4 mg under the tongue every 5 (five) minutes as needed for chest pain.   polyethylene glycol 17 g packet Commonly known as: MiraLax Take 17 g by mouth daily as needed.   PRESERVISION AREDS PO Take 1 capsule by mouth 2 (two) times daily.   Vitamin D3 25 MCG (1000 UT) Caps Take 1,000 Units by mouth daily.               Durable Medical Equipment  (From admission, onward)           Start     Ordered   04/23/21 0548  For home use only DME  Walker rolling  Once       Question Answer Comment  Walker: With 5 Inch Wheels   Patient needs a walker to treat with the following condition Ambulatory dysfunction      04/23/21 0547           Allergies  Allergen Reactions   Tape Other (See Comments)    TAPE CAN TEAR THE SKIN- FRAGILE!!   Atorvastatin Other (See Comments)    Made the patient's muscles hurt   Erythromycin Other (See Comments)    "Made me sick"- GI upset    Erythromycin Base Other (See Comments)    GI upset   Other Nausea And Vomiting and Other (See Comments)    "Oysters- nausea & vomiting noted, 04/29"   Sulfamethoxazole Nausea And Vomiting   Codeine Nausea And Vomiting and Other (See Comments)    GI upset    Oyster Extract Nausea And Vomiting   Sulfa Antibiotics Nausea And Vomiting    Follow-up Information     Hamrick, Lorin Mercy, MD. Call today.   Specialty: Family Medicine Why: Please call for a posthospital follow-up appointment. Contact information: [place] [address] [place], [facility] Follow up.   Specialty: Home Health Services Why: [facility] will provide PT and OT in the home.                 The results of significant diagnostics from this hospitalization (including imaging, microbiology, ancillary and laboratory) are listed below for reference.    Significant Diagnostic Studies: CT Angio Chest PE W and/or Wo Contrast  Result Date: 04/20/2021 CLINICAL DATA:  Shortness of breath on exertion with chills. Recent COVID-19 infection. Clinical concern for pulmonary embolism. EXAM: CT ANGIOGRAPHY CHEST WITH CONTRAST TECHNIQUE: Multidetector CT imaging of the chest was performed using the standard protocol during bolus administration of intravenous contrast. Multiplanar CT image reconstructions and MIPs were obtained to evaluate the vascular anatomy. CONTRAST:  55mL OMNIPAQUE IOHEXOL 350 MG/ML SOLN COMPARISON:  Radiographs  04/20/2021. CT 11/10/2002 (without report). FINDINGS: Cardiovascular: The pulmonary arteries are well opacified with contrast to the level of the subsegmental branches. There is no evidence of acute pulmonary embolism. Diffuse atherosclerosis of the aorta, great vessels and coronary arteries status post median sternotomy, aortic valve replacement and probable CABG. The heart size is normal. There is no pericardial effusion. Mediastinum/Nodes: There are no enlarged mediastinal, hilar or axillary lymph nodes.Small mediastinal lymph nodes appear unchanged. There is a small hiatal hernia. The thyroid gland and trachea demonstrate  no significant findings. Lungs/Pleura: No pleural effusion or pneumothorax. There is some pleural thickening on the left. Evidence of chronic lung disease with emphysema, subpleural reticulation, mild traction bronchiectasis and multiple nodular airspace opacities in both lungs, similar to remote prior CT, but mildly progressive. Most of these are subpleural in location, although there is an intraparenchymal component at the right apex measuring up to 1.6 cm on image 23/6. Upper abdomen: No acute findings are seen within the visualized upper abdomen. There is a small cyst in the left hepatic lobe. Both kidneys demonstrate mild cortical thinning. Musculoskeletal/Chest wall: There is no chest wall mass or suspicious osseous finding. Previous median sternotomy. Prominent thoracic kyphosis. Review of the MIP images confirms the above findings. IMPRESSION: 1. No evidence of acute pulmonary embolism or other acute vascular findings in the chest. 2. Multifocal, predominantly peripheral airspace opacities in both lungs are similar to remote CT from 2004, but mildly progressive. Findings could relate to chronic atypical inflammation such as organizing pneumonia or vasculitis. Given the progression and recent COVID-19 infection, superimposed pneumonia not excluded. Recommend at least chest radiographic  follow-up in 4-6 weeks. Chest CT follow-up may be warranted. 3. No adenopathy or significant pleural effusion. 4. Coronary and Aortic Atherosclerosis (ICD10-I70.0). Post AVR and probable CABG. Electronically Signed   By: Richardean Sale M.D.   On: 04/20/2021 16:21   US RENAL  Result Date: 04/20/2021 CLINICAL DATA:  Left flank pain. EXAM: RENAL / URINARY TRACT ULTRASOUND COMPLETE COMPARISON:  Renal ultrasound dated 10/13/2011. FINDINGS: Right Kidney: Renal measurements: 9.1 x 5.3 x 5.0 cm = volume: 126 mL. There is increased echogenicity and parenchyma atrophy and cortical thinning. Moderate pelviectasis. No shadowing stone. Left Kidney: Renal measurements: 8.8 x 4.2 x 5.0 cm = volume: 97 mL. Increased echogenicity with parenchyma atrophy. No hydronephrosis or shadowing stone. Bladder: Appears normal for degree of bladder distention. Other: None. IMPRESSION: 1. Atrophic and echogenic kidneys in keeping with chronic kidney disease. 2. Moderate right pelviectasis.  No shadowing stone. Electronically Signed   By: Anner Crete M.D.   On: 04/20/2021 21:48   DG Chest Port 1 View  Result Date: 04/20/2021 CLINICAL DATA:  Shortness of breath EXAM: PORTABLE CHEST 1 VIEW COMPARISON:  Previous study done on 11/03/2011. Immediate previous study done on 06/15/2017 is not available for comparison due to technical difficulties. FINDINGS: Transverse diameter of heart is increased. Prosthetic cardiac valve is seen. There is no evidence pulmonary vascular congestion or pulmonary edema. There is faint linear density in the right parahilar region. Increased markings are seen in the lateral aspect of left lower lung fields. There is blunting of left lateral CP angle. There is no pneumothorax. IMPRESSION: There are no signs of alveolar pulmonary edema. Small linear density in the right parahilar region and increased markings in the lateral left lower lung fields may suggest scarring or pneumonitis. There is pleural density in the  left lateral costophrenic angle suggesting small effusion or pleural thickening. Electronically Signed   By: Elmer Picker M.D.   On: 04/20/2021 13:43    Microbiology: Recent Results (from the past 240 hour(s))  Resp Panel by RT-PCR (Flu A&B, Covid) Nasopharyngeal Swab     Status: Abnormal   Collection Time: 04/20/21  5:01 PM   Specimen: Nasopharyngeal Swab; Nasopharyngeal(NP) swabs in vial transport medium  Result Value Ref Range Status   SARS Coronavirus 2 by RT PCR POSITIVE (A) NEGATIVE Final    Comment: (NOTE) SARS-CoV-2 target nucleic acids are DETECTED.  The SARS-CoV-2 RNA  is generally detectable in upper respiratory specimens during the acute phase of infection. Positive results are indicative of the presence of the identified virus, but do not rule out bacterial infection or co-infection with other pathogens not detected by the test. Clinical correlation with patient history and other diagnostic information is necessary to determine patient infection status. The expected result is Negative.  Fact Sheet for Patients: EntrepreneurPulse.com.au  Fact Sheet for Healthcare Providers: IncredibleEmployment.be  This test is not yet approved or cleared by the [place] FDA and  has been authorized for detection and/or diagnosis of SARS-CoV-2 by FDA under an Emergency Use Authorization (EUA).  This EUA will remain in effect (meaning this test can be used) for the duration of  the COVID-19 declaration under Section 564(b)(1) of the A ct, 21 U.S.C. section 360bbb-3(b)(1), unless the authorization is terminated or revoked sooner.     Influenza A by PCR NEGATIVE NEGATIVE Final   Influenza B by PCR NEGATIVE NEGATIVE Final    Comment: (NOTE) The Xpert Xpress SARS-CoV-2/FLU/RSV plus assay is intended as an aid in the diagnosis of influenza from Nasopharyngeal swab specimens and should not be used as a sole basis for treatment. Nasal washings  and aspirates are unacceptable for Xpert Xpress SARS-CoV-2/FLU/RSV testing.  Fact Sheet for Patients: EntrepreneurPulse.com.au  Fact Sheet for Healthcare Providers: IncredibleEmployment.be  This test is not yet approved or cleared by the [place] FDA and has been authorized for detection and/or diagnosis of SARS-CoV-2 by FDA under an Emergency Use Authorization (EUA). This EUA will remain in effect (meaning this test can be used) for the duration of the COVID-19 declaration under Section 564(b)(1) of the Act, 21 U.S.C. section 360bbb-3(b)(1), unless the authorization is terminated or revoked.  Performed at [facility], [place] [address], [place], [place] [address]   Respiratory (~20 pathogens) panel by PCR     Status: None   Collection Time: 04/21/21  4:59 PM   Specimen: Nasopharyngeal Swab; Respiratory  Result Value Ref Range Status   Adenovirus NOT DETECTED NOT DETECTED Final   Coronavirus 229E NOT DETECTED NOT DETECTED Final    Comment: (NOTE) The Coronavirus on the Respiratory Panel, DOES NOT test for the novel  Coronavirus (2019 nCoV)    Coronavirus HKU1 NOT DETECTED NOT DETECTED Final   Coronavirus NL63 NOT DETECTED NOT DETECTED Final   Coronavirus OC43 NOT DETECTED NOT DETECTED Final   Metapneumovirus NOT DETECTED NOT DETECTED Final   Rhinovirus / Enterovirus NOT DETECTED NOT DETECTED Final   Influenza A NOT DETECTED NOT DETECTED Final   Influenza B NOT DETECTED NOT DETECTED Final   Parainfluenza Virus 1 NOT DETECTED NOT DETECTED Final   Parainfluenza Virus 2 NOT DETECTED NOT DETECTED Final   Parainfluenza Virus 3 NOT DETECTED NOT DETECTED Final   Parainfluenza Virus 4 NOT DETECTED NOT DETECTED Final   Respiratory Syncytial Virus NOT DETECTED NOT DETECTED Final   Bordetella pertussis NOT DETECTED NOT DETECTED Final   Bordetella Parapertussis NOT DETECTED NOT DETECTED Final   Chlamydophila pneumoniae NOT  DETECTED NOT DETECTED Final   Mycoplasma pneumoniae NOT DETECTED NOT DETECTED Final    Comment: Performed at [facility], [place]. [address] [place], [place] [address]     Labs: Basic Metabolic Panel: Recent Labs  Lab 04/20/21 1324 04/21/21 0448 04/21/21 2042 04/22/21 0445 04/23/21 0648 04/24/21 0444  NA 134* 139  --  135 133* 136  K 5.2* 5.5* 4.5 5.4* 5.0 4.1  CL 108 109  --  107 107 104  CO2 22 19*  --  19* 19* 22  GLUCOSE 90 127*  --  139* 149* 88  BUN 23 23  --  31* 35* 37*  CREATININE 1.42* 1.34*  --  1.56* 1.83* 1.45*  CALCIUM 8.9 9.2  --  8.9 8.7* 8.3*  MG  --  1.9  --   --   --   --   PHOS  --  4.9*  --   --   --   --    Liver Function Tests: Recent Labs  Lab 04/20/21 1324 04/21/21 0448  AST 16 16  ALT 15 15  ALKPHOS 59 58  BILITOT 0.4 0.4  PROT 7.2 7.0  ALBUMIN 2.8* 2.8*   No results for input(s): LIPASE, AMYLASE in the last 168 hours. No results for input(s): AMMONIA in the last 168 hours. CBC: Recent Labs  Lab 04/20/21 1706 04/21/21 0448  WBC 7.2 8.1  NEUTROABS 5.2 7.4  HGB 10.2* 10.3*  HCT 33.2* 33.5*  MCV 93.8 94.9  PLT 489* 490*   Cardiac Enzymes: No results for input(s): CKTOTAL, CKMB, CKMBINDEX, TROPONINI in the last 168 hours. BNP: BNP (last 3 results) Recent Labs    04/20/21 1324  BNP 59.5    ProBNP (last 3 results) No results for input(s): PROBNP in the last 8760 hours.  CBG: No results for input(s): GLUCAP in the last 168 hours.     Signed:  Kayleen Memos, MD [facility] 04/24/2021, 5:38 PM

## 2021-04-24 NOTE — Progress Notes (Signed)
Pt being discharged to home with niece. Discharge instructions and medication education provided to pt.

## 2021-04-24 NOTE — TOC Transition Note (Signed)
Transition of Care [facility]) - CM/SW Discharge Note  Patient Details  Name: [name] MRN: [id number] Date of Birth: June 11, 1931  Transition of [facility]) CM/SW Contact:  Jennifer Don, LCSW Phone Number: 04/24/2021, 1:19 PM  Clinical Narrative: PT is now recommending [place] and patient's niece, Jennifer Joyce, is agreeable to [facility] referral. CSW made [facility] referral to [facility] with [facility] for PT and OT; referral accepted. Orders are in. CSW updated niece regarding [place] agency. Niece will make some calls to set up transportation for the patient to get home. TOC signing off.  Final next level of care: [place] Barriers to Discharge: Barriers Resolved  Patient Goals and CMS Choice Patient states their goals for this hospitalization and ongoing recovery are:: Return home CMS Medicare.gov Compare Post Acute Care list provided to:: Patient Represenative (must comment) Choice offered to / list presented to : Patient  Discharge Plan and Services Discharge Planning Services: CM Consult Post Acute Care Choice: [place]          DME Arranged: N/A DME Agency: NA HH Arranged: PT, OT [place] Agency: [place] ([place]) Date HH Agency Contacted: 04/24/21 Representative spoke with at [place]: Jennifer Joyce  Readmission Risk Interventions No flowsheet data found.

## 2021-04-24 NOTE — Progress Notes (Signed)
Physical Therapy Treatment Patient Details Name: Jennifer Joyce MRN: [id number] DOB: [birth year] Today's Date: 04/24/2021   History of Present Illness 88 yo female admitted with COVID, weakness. Hx of CAD, CABG, CKD, osteoporosis.    PT Comments    Pt making good progress.  She ambulated 200' without AD with mild waivering in gait but no true loss of balance.  She also performed 6 steps.  Had no dizziness and VSS on RA.  Did discuss use of RW at home initially - pt has one.  Updated recommendation to home with HHPT and intermittent supervision.     Recommendations for follow up therapy are one component of a multi-disciplinary discharge planning process, led by the attending physician.  Recommendations may be updated based on patient status, additional functional criteria and insurance authorization.  Follow Up Recommendations  Home health PT     Assistance Recommended at Discharge Intermittent Supervision/Assistance  Patient can return home with the following Assistance with cooking/housework;Help with stairs or ramp for entrance   Equipment Recommendations  None recommended by PT    Recommendations for Other Services       Precautions / Restrictions Precautions Precautions: Fall     Mobility  Bed Mobility               General bed mobility comments: in chair    Transfers Overall transfer level: Needs assistance Equipment used: Rolling walker (2 wheels) Transfers: Sit to/from Stand Sit to Stand: Supervision           General transfer comment: performed safely x 6 during session    Ambulation/Gait Ambulation/Gait assistance: Supervision Gait Distance (Feet): 200 Feet Assistive device: None Gait Pattern/deviations: Step-through pattern Gait velocity: decreased     General Gait Details: Mild waivering in gait pattern - but no LOB.  Pt aware.  Reports has a walker she could use initially   Stairs Stairs: Yes Stairs assistance: Supervision Stair  Management: Step to pattern;Forwards;Alternating pattern;One rail Left Number of Stairs: 6 General stair comments: alternating up, step to down; pt has flight to enter back of home but reports could walk around front if ground not too wet   Wheelchair Mobility    Modified Rankin (Stroke Patients Only)       Balance Overall balance assessment: Needs assistance Sitting-balance support: No upper extremity supported Sitting balance-Leahy Scale: Normal     Standing balance support: No upper extremity supported Standing balance-Leahy Scale: Good Standing balance comment: donned robe in standing, walked without LOB                            Cognition Arousal/Alertness: Awake/alert Behavior During Therapy: WFL for tasks assessed/performed Overall Cognitive Status: Within Functional Limits for tasks assessed                                          Exercises Other Exercises Other Exercises: 5 time sit to stand    General Comments General comments (skin integrity, edema, etc.): Pt on RA with sats 97% rest and 95% walk      Pertinent Vitals/Pain Pain Assessment: No/denies pain    Home Living                          Prior Function  PT Goals (current goals can now be found in the care plan section) Progress towards PT goals: Progressing toward goals    Frequency    Min 3X/week      PT Plan Discharge plan needs to be updated    Co-evaluation              AM-PAC PT "6 Clicks" Mobility   Outcome Measure  Help needed turning from your back to your side while in a flat bed without using bedrails?: None Help needed moving from lying on your back to sitting on the side of a flat bed without using bedrails?: None Help needed moving to and from a bed to a chair (including a wheelchair)?: A Little Help needed standing up from a chair using your arms (e.g., wheelchair or bedside chair)?: A Little Help needed to walk  in hospital room?: A Little Help needed climbing 3-5 steps with a railing? : A Little 6 Click Score: 20    End of Session   Activity Tolerance: Patient tolerated treatment well Patient left: with call bell/phone within reach;in chair Nurse Communication: Mobility status PT Visit Diagnosis: Muscle weakness (generalized) (M62.81);Difficulty in walking, not elsewhere classified (R26.2)     Time: 1696-7893 PT Time Calculation (min) (ACUTE ONLY): 20 min  Charges:  $Gait Training: 8-22 mins                     Abran Richard, PT Acute Rehab Services Pager [phone] [facility] [place] 04/24/2021, 11:23 AM

## 2021-04-24 NOTE — Plan of Care (Signed)
No acute events overnight. Pt states she is feeling better. Problem: Education: Goal: Knowledge of General Education information will improve Description: Including pain rating scale, medication(s)/side effects and non-pharmacologic comfort measures Outcome: Progressing   Problem: Health Behavior/Discharge Planning: Goal: Ability to manage health-related needs will improve Outcome: Progressing   Problem: Clinical Measurements: Goal: Ability to maintain clinical measurements within normal limits will improve Outcome: Progressing Goal: Will remain free from infection Outcome: Progressing Goal: Diagnostic test results will improve Outcome: Progressing Goal: Respiratory complications will improve Outcome: Progressing Goal: Cardiovascular complication will be avoided Outcome: Progressing   Problem: Activity: Goal: Risk for activity intolerance will decrease Outcome: Progressing   Problem: Nutrition: Goal: Adequate nutrition will be maintained Outcome: Progressing   Problem: Coping: Goal: Level of anxiety will decrease Outcome: Progressing   Problem: Elimination: Goal: Will not experience complications related to bowel motility Outcome: Progressing Goal: Will not experience complications related to urinary retention Outcome: Progressing   Problem: Pain Managment: Goal: General experience of comfort will improve Outcome: Progressing   Problem: Safety: Goal: Ability to remain free from injury will improve Outcome: Progressing   Problem: Skin Integrity: Goal: Risk for impaired skin integrity will decrease Outcome: Progressing   Problem: Education: Goal: Knowledge of risk factors and measures for prevention of condition will improve Outcome: Progressing   Problem: Coping: Goal: Psychosocial and spiritual needs will be supported Outcome: Progressing   Problem: Respiratory: Goal: Will maintain a patent airway Outcome: Progressing Goal: Complications related to the  disease process, condition or treatment will be avoided or minimized Outcome: Progressing

## 2021-04-26 DIAGNOSIS — F32A Depression, unspecified: Secondary | ICD-10-CM | POA: Diagnosis not present

## 2021-04-26 DIAGNOSIS — Z7982 Long term (current) use of aspirin: Secondary | ICD-10-CM | POA: Diagnosis not present

## 2021-04-26 DIAGNOSIS — I2510 Atherosclerotic heart disease of native coronary artery without angina pectoris: Secondary | ICD-10-CM | POA: Diagnosis not present

## 2021-04-26 DIAGNOSIS — U071 COVID-19: Secondary | ICD-10-CM | POA: Diagnosis not present

## 2021-04-26 DIAGNOSIS — F419 Anxiety disorder, unspecified: Secondary | ICD-10-CM | POA: Diagnosis not present

## 2021-04-26 DIAGNOSIS — J1282 Pneumonia due to coronavirus disease 2019: Secondary | ICD-10-CM | POA: Diagnosis not present

## 2021-04-26 DIAGNOSIS — K5790 Diverticulosis of intestine, part unspecified, without perforation or abscess without bleeding: Secondary | ICD-10-CM | POA: Diagnosis not present

## 2021-04-26 DIAGNOSIS — M179 Osteoarthritis of knee, unspecified: Secondary | ICD-10-CM | POA: Diagnosis not present

## 2021-04-26 DIAGNOSIS — R918 Other nonspecific abnormal finding of lung field: Secondary | ICD-10-CM | POA: Diagnosis not present

## 2021-04-26 DIAGNOSIS — Z9181 History of falling: Secondary | ICD-10-CM | POA: Diagnosis not present

## 2021-04-26 DIAGNOSIS — E7800 Pure hypercholesterolemia, unspecified: Secondary | ICD-10-CM | POA: Diagnosis not present

## 2021-04-26 DIAGNOSIS — H269 Unspecified cataract: Secondary | ICD-10-CM | POA: Diagnosis not present

## 2021-04-26 DIAGNOSIS — M810 Age-related osteoporosis without current pathological fracture: Secondary | ICD-10-CM | POA: Diagnosis not present

## 2021-04-26 DIAGNOSIS — I129 Hypertensive chronic kidney disease with stage 1 through stage 4 chronic kidney disease, or unspecified chronic kidney disease: Secondary | ICD-10-CM | POA: Diagnosis not present

## 2021-04-26 DIAGNOSIS — E039 Hypothyroidism, unspecified: Secondary | ICD-10-CM | POA: Diagnosis not present

## 2021-04-26 DIAGNOSIS — R351 Nocturia: Secondary | ICD-10-CM | POA: Diagnosis not present

## 2021-04-26 DIAGNOSIS — E041 Nontoxic single thyroid nodule: Secondary | ICD-10-CM | POA: Diagnosis not present

## 2021-04-26 DIAGNOSIS — H9190 Unspecified hearing loss, unspecified ear: Secondary | ICD-10-CM | POA: Diagnosis not present

## 2021-04-26 DIAGNOSIS — N1832 Chronic kidney disease, stage 3b: Secondary | ICD-10-CM | POA: Diagnosis not present

## 2021-04-28 DIAGNOSIS — I2510 Atherosclerotic heart disease of native coronary artery without angina pectoris: Secondary | ICD-10-CM | POA: Diagnosis not present

## 2021-04-28 DIAGNOSIS — J1282 Pneumonia due to coronavirus disease 2019: Secondary | ICD-10-CM | POA: Diagnosis not present

## 2021-04-28 DIAGNOSIS — U071 COVID-19: Secondary | ICD-10-CM | POA: Diagnosis not present

## 2021-04-28 DIAGNOSIS — N1832 Chronic kidney disease, stage 3b: Secondary | ICD-10-CM | POA: Diagnosis not present

## 2021-04-28 DIAGNOSIS — I129 Hypertensive chronic kidney disease with stage 1 through stage 4 chronic kidney disease, or unspecified chronic kidney disease: Secondary | ICD-10-CM | POA: Diagnosis not present

## 2021-04-28 DIAGNOSIS — R918 Other nonspecific abnormal finding of lung field: Secondary | ICD-10-CM | POA: Diagnosis not present

## 2021-04-30 DIAGNOSIS — J1282 Pneumonia due to coronavirus disease 2019: Secondary | ICD-10-CM | POA: Diagnosis not present

## 2021-04-30 DIAGNOSIS — I129 Hypertensive chronic kidney disease with stage 1 through stage 4 chronic kidney disease, or unspecified chronic kidney disease: Secondary | ICD-10-CM | POA: Diagnosis not present

## 2021-04-30 DIAGNOSIS — U071 COVID-19: Secondary | ICD-10-CM | POA: Diagnosis not present

## 2021-04-30 DIAGNOSIS — I2510 Atherosclerotic heart disease of native coronary artery without angina pectoris: Secondary | ICD-10-CM | POA: Diagnosis not present

## 2021-04-30 DIAGNOSIS — R918 Other nonspecific abnormal finding of lung field: Secondary | ICD-10-CM | POA: Diagnosis not present

## 2021-04-30 DIAGNOSIS — N1832 Chronic kidney disease, stage 3b: Secondary | ICD-10-CM | POA: Diagnosis not present

## 2021-05-01 DIAGNOSIS — Z7689 Persons encountering health services in other specified circumstances: Secondary | ICD-10-CM | POA: Diagnosis not present

## 2021-05-01 DIAGNOSIS — J189 Pneumonia, unspecified organism: Secondary | ICD-10-CM | POA: Diagnosis not present

## 2021-05-04 DIAGNOSIS — N1832 Chronic kidney disease, stage 3b: Secondary | ICD-10-CM | POA: Diagnosis not present

## 2021-05-04 DIAGNOSIS — U071 COVID-19: Secondary | ICD-10-CM | POA: Diagnosis not present

## 2021-05-04 DIAGNOSIS — J1282 Pneumonia due to coronavirus disease 2019: Secondary | ICD-10-CM | POA: Diagnosis not present

## 2021-05-04 DIAGNOSIS — I129 Hypertensive chronic kidney disease with stage 1 through stage 4 chronic kidney disease, or unspecified chronic kidney disease: Secondary | ICD-10-CM | POA: Diagnosis not present

## 2021-05-04 DIAGNOSIS — R918 Other nonspecific abnormal finding of lung field: Secondary | ICD-10-CM | POA: Diagnosis not present

## 2021-05-04 DIAGNOSIS — I2510 Atherosclerotic heart disease of native coronary artery without angina pectoris: Secondary | ICD-10-CM | POA: Diagnosis not present

## 2021-05-06 DIAGNOSIS — N1832 Chronic kidney disease, stage 3b: Secondary | ICD-10-CM | POA: Diagnosis not present

## 2021-05-06 DIAGNOSIS — U071 COVID-19: Secondary | ICD-10-CM | POA: Diagnosis not present

## 2021-05-06 DIAGNOSIS — I129 Hypertensive chronic kidney disease with stage 1 through stage 4 chronic kidney disease, or unspecified chronic kidney disease: Secondary | ICD-10-CM | POA: Diagnosis not present

## 2021-05-06 DIAGNOSIS — R918 Other nonspecific abnormal finding of lung field: Secondary | ICD-10-CM | POA: Diagnosis not present

## 2021-05-06 DIAGNOSIS — I2510 Atherosclerotic heart disease of native coronary artery without angina pectoris: Secondary | ICD-10-CM | POA: Diagnosis not present

## 2021-05-06 DIAGNOSIS — J1282 Pneumonia due to coronavirus disease 2019: Secondary | ICD-10-CM | POA: Diagnosis not present

## 2021-05-07 DIAGNOSIS — J1282 Pneumonia due to coronavirus disease 2019: Secondary | ICD-10-CM | POA: Diagnosis not present

## 2021-05-07 DIAGNOSIS — I2510 Atherosclerotic heart disease of native coronary artery without angina pectoris: Secondary | ICD-10-CM | POA: Diagnosis not present

## 2021-05-07 DIAGNOSIS — I129 Hypertensive chronic kidney disease with stage 1 through stage 4 chronic kidney disease, or unspecified chronic kidney disease: Secondary | ICD-10-CM | POA: Diagnosis not present

## 2021-05-07 DIAGNOSIS — R918 Other nonspecific abnormal finding of lung field: Secondary | ICD-10-CM | POA: Diagnosis not present

## 2021-05-07 DIAGNOSIS — U071 COVID-19: Secondary | ICD-10-CM | POA: Diagnosis not present

## 2021-05-07 DIAGNOSIS — N1832 Chronic kidney disease, stage 3b: Secondary | ICD-10-CM | POA: Diagnosis not present

## 2021-05-12 DIAGNOSIS — R918 Other nonspecific abnormal finding of lung field: Secondary | ICD-10-CM | POA: Diagnosis not present

## 2021-05-12 DIAGNOSIS — J1282 Pneumonia due to coronavirus disease 2019: Secondary | ICD-10-CM | POA: Diagnosis not present

## 2021-05-12 DIAGNOSIS — U071 COVID-19: Secondary | ICD-10-CM | POA: Diagnosis not present

## 2021-05-12 DIAGNOSIS — N1832 Chronic kidney disease, stage 3b: Secondary | ICD-10-CM | POA: Diagnosis not present

## 2021-05-12 DIAGNOSIS — I2510 Atherosclerotic heart disease of native coronary artery without angina pectoris: Secondary | ICD-10-CM | POA: Diagnosis not present

## 2021-05-12 DIAGNOSIS — I129 Hypertensive chronic kidney disease with stage 1 through stage 4 chronic kidney disease, or unspecified chronic kidney disease: Secondary | ICD-10-CM | POA: Diagnosis not present

## 2021-05-14 DIAGNOSIS — I2510 Atherosclerotic heart disease of native coronary artery without angina pectoris: Secondary | ICD-10-CM | POA: Diagnosis not present

## 2021-05-14 DIAGNOSIS — I129 Hypertensive chronic kidney disease with stage 1 through stage 4 chronic kidney disease, or unspecified chronic kidney disease: Secondary | ICD-10-CM | POA: Diagnosis not present

## 2021-05-14 DIAGNOSIS — N1832 Chronic kidney disease, stage 3b: Secondary | ICD-10-CM | POA: Diagnosis not present

## 2021-05-14 DIAGNOSIS — R918 Other nonspecific abnormal finding of lung field: Secondary | ICD-10-CM | POA: Diagnosis not present

## 2021-05-14 DIAGNOSIS — U071 COVID-19: Secondary | ICD-10-CM | POA: Diagnosis not present

## 2021-05-14 DIAGNOSIS — J1282 Pneumonia due to coronavirus disease 2019: Secondary | ICD-10-CM | POA: Diagnosis not present

## 2021-05-18 DIAGNOSIS — I129 Hypertensive chronic kidney disease with stage 1 through stage 4 chronic kidney disease, or unspecified chronic kidney disease: Secondary | ICD-10-CM | POA: Diagnosis not present

## 2021-05-18 DIAGNOSIS — J1282 Pneumonia due to coronavirus disease 2019: Secondary | ICD-10-CM | POA: Diagnosis not present

## 2021-05-18 DIAGNOSIS — U071 COVID-19: Secondary | ICD-10-CM | POA: Diagnosis not present

## 2021-05-18 DIAGNOSIS — N1832 Chronic kidney disease, stage 3b: Secondary | ICD-10-CM | POA: Diagnosis not present

## 2021-05-18 DIAGNOSIS — I2510 Atherosclerotic heart disease of native coronary artery without angina pectoris: Secondary | ICD-10-CM | POA: Diagnosis not present

## 2021-05-18 DIAGNOSIS — R918 Other nonspecific abnormal finding of lung field: Secondary | ICD-10-CM | POA: Diagnosis not present

## 2021-05-20 DIAGNOSIS — I2510 Atherosclerotic heart disease of native coronary artery without angina pectoris: Secondary | ICD-10-CM | POA: Diagnosis not present

## 2021-05-20 DIAGNOSIS — I129 Hypertensive chronic kidney disease with stage 1 through stage 4 chronic kidney disease, or unspecified chronic kidney disease: Secondary | ICD-10-CM | POA: Diagnosis not present

## 2021-05-20 DIAGNOSIS — J1282 Pneumonia due to coronavirus disease 2019: Secondary | ICD-10-CM | POA: Diagnosis not present

## 2021-05-20 DIAGNOSIS — U071 COVID-19: Secondary | ICD-10-CM | POA: Diagnosis not present

## 2021-05-20 DIAGNOSIS — R918 Other nonspecific abnormal finding of lung field: Secondary | ICD-10-CM | POA: Diagnosis not present

## 2021-05-20 DIAGNOSIS — N1832 Chronic kidney disease, stage 3b: Secondary | ICD-10-CM | POA: Diagnosis not present

## 2021-05-26 DIAGNOSIS — E039 Hypothyroidism, unspecified: Secondary | ICD-10-CM | POA: Diagnosis not present

## 2021-05-26 DIAGNOSIS — F419 Anxiety disorder, unspecified: Secondary | ICD-10-CM | POA: Diagnosis not present

## 2021-05-26 DIAGNOSIS — N1832 Chronic kidney disease, stage 3b: Secondary | ICD-10-CM | POA: Diagnosis not present

## 2021-05-26 DIAGNOSIS — Z9181 History of falling: Secondary | ICD-10-CM | POA: Diagnosis not present

## 2021-05-26 DIAGNOSIS — M179 Osteoarthritis of knee, unspecified: Secondary | ICD-10-CM | POA: Diagnosis not present

## 2021-05-26 DIAGNOSIS — E7800 Pure hypercholesterolemia, unspecified: Secondary | ICD-10-CM | POA: Diagnosis not present

## 2021-05-26 DIAGNOSIS — H9190 Unspecified hearing loss, unspecified ear: Secondary | ICD-10-CM | POA: Diagnosis not present

## 2021-05-26 DIAGNOSIS — R351 Nocturia: Secondary | ICD-10-CM | POA: Diagnosis not present

## 2021-05-26 DIAGNOSIS — H269 Unspecified cataract: Secondary | ICD-10-CM | POA: Diagnosis not present

## 2021-05-26 DIAGNOSIS — I129 Hypertensive chronic kidney disease with stage 1 through stage 4 chronic kidney disease, or unspecified chronic kidney disease: Secondary | ICD-10-CM | POA: Diagnosis not present

## 2021-05-26 DIAGNOSIS — K5790 Diverticulosis of intestine, part unspecified, without perforation or abscess without bleeding: Secondary | ICD-10-CM | POA: Diagnosis not present

## 2021-05-26 DIAGNOSIS — F32A Depression, unspecified: Secondary | ICD-10-CM | POA: Diagnosis not present

## 2021-05-26 DIAGNOSIS — U071 COVID-19: Secondary | ICD-10-CM | POA: Diagnosis not present

## 2021-05-26 DIAGNOSIS — Z7982 Long term (current) use of aspirin: Secondary | ICD-10-CM | POA: Diagnosis not present

## 2021-05-26 DIAGNOSIS — M810 Age-related osteoporosis without current pathological fracture: Secondary | ICD-10-CM | POA: Diagnosis not present

## 2021-05-26 DIAGNOSIS — E041 Nontoxic single thyroid nodule: Secondary | ICD-10-CM | POA: Diagnosis not present

## 2021-05-26 DIAGNOSIS — R918 Other nonspecific abnormal finding of lung field: Secondary | ICD-10-CM | POA: Diagnosis not present

## 2021-05-26 DIAGNOSIS — I2510 Atherosclerotic heart disease of native coronary artery without angina pectoris: Secondary | ICD-10-CM | POA: Diagnosis not present

## 2021-05-26 DIAGNOSIS — J1282 Pneumonia due to coronavirus disease 2019: Secondary | ICD-10-CM | POA: Diagnosis not present

## 2021-05-29 DIAGNOSIS — H43813 Vitreous degeneration, bilateral: Secondary | ICD-10-CM | POA: Diagnosis not present

## 2021-05-29 DIAGNOSIS — H353132 Nonexudative age-related macular degeneration, bilateral, intermediate dry stage: Secondary | ICD-10-CM | POA: Diagnosis not present

## 2021-05-29 DIAGNOSIS — H35372 Puckering of macula, left eye: Secondary | ICD-10-CM | POA: Diagnosis not present

## 2021-05-29 DIAGNOSIS — Z961 Presence of intraocular lens: Secondary | ICD-10-CM | POA: Diagnosis not present

## 2021-07-01 DIAGNOSIS — I2583 Coronary atherosclerosis due to lipid rich plaque: Secondary | ICD-10-CM | POA: Diagnosis not present

## 2021-07-01 DIAGNOSIS — Z953 Presence of xenogenic heart valve: Secondary | ICD-10-CM | POA: Diagnosis not present

## 2021-07-01 DIAGNOSIS — I2510 Atherosclerotic heart disease of native coronary artery without angina pectoris: Secondary | ICD-10-CM | POA: Diagnosis not present

## 2021-07-01 DIAGNOSIS — Z8616 Personal history of COVID-19: Secondary | ICD-10-CM | POA: Diagnosis not present

## 2021-07-01 DIAGNOSIS — J449 Chronic obstructive pulmonary disease, unspecified: Secondary | ICD-10-CM | POA: Diagnosis not present

## 2021-07-01 DIAGNOSIS — N183 Chronic kidney disease, stage 3 unspecified: Secondary | ICD-10-CM | POA: Diagnosis not present

## 2021-07-01 DIAGNOSIS — R9389 Abnormal findings on diagnostic imaging of other specified body structures: Secondary | ICD-10-CM | POA: Diagnosis not present

## 2021-07-01 DIAGNOSIS — R0602 Shortness of breath: Secondary | ICD-10-CM | POA: Diagnosis not present

## 2021-07-10 DIAGNOSIS — I10 Essential (primary) hypertension: Secondary | ICD-10-CM | POA: Diagnosis not present

## 2021-07-10 DIAGNOSIS — I2510 Atherosclerotic heart disease of native coronary artery without angina pectoris: Secondary | ICD-10-CM | POA: Diagnosis not present

## 2021-07-10 DIAGNOSIS — I509 Heart failure, unspecified: Secondary | ICD-10-CM | POA: Diagnosis not present

## 2021-07-15 DIAGNOSIS — I083 Combined rheumatic disorders of mitral, aortic and tricuspid valves: Secondary | ICD-10-CM | POA: Diagnosis not present

## 2021-07-15 DIAGNOSIS — Z8679 Personal history of other diseases of the circulatory system: Secondary | ICD-10-CM | POA: Diagnosis not present

## 2021-07-15 DIAGNOSIS — Z79899 Other long term (current) drug therapy: Secondary | ICD-10-CM | POA: Diagnosis not present

## 2021-07-15 DIAGNOSIS — I951 Orthostatic hypotension: Secondary | ICD-10-CM | POA: Diagnosis not present

## 2021-07-15 DIAGNOSIS — Z952 Presence of prosthetic heart valve: Secondary | ICD-10-CM | POA: Diagnosis not present

## 2021-07-15 DIAGNOSIS — I081 Rheumatic disorders of both mitral and tricuspid valves: Secondary | ICD-10-CM | POA: Diagnosis not present

## 2021-08-03 DIAGNOSIS — E7800 Pure hypercholesterolemia, unspecified: Secondary | ICD-10-CM | POA: Diagnosis not present

## 2021-08-03 DIAGNOSIS — E039 Hypothyroidism, unspecified: Secondary | ICD-10-CM | POA: Diagnosis not present

## 2021-08-03 DIAGNOSIS — N184 Chronic kidney disease, stage 4 (severe): Secondary | ICD-10-CM | POA: Diagnosis not present

## 2021-08-03 DIAGNOSIS — E559 Vitamin D deficiency, unspecified: Secondary | ICD-10-CM | POA: Diagnosis not present

## 2021-08-05 DIAGNOSIS — E559 Vitamin D deficiency, unspecified: Secondary | ICD-10-CM | POA: Diagnosis not present

## 2021-08-05 DIAGNOSIS — I509 Heart failure, unspecified: Secondary | ICD-10-CM | POA: Diagnosis not present

## 2021-08-05 DIAGNOSIS — J8410 Pulmonary fibrosis, unspecified: Secondary | ICD-10-CM | POA: Diagnosis not present

## 2021-08-05 DIAGNOSIS — N184 Chronic kidney disease, stage 4 (severe): Secondary | ICD-10-CM | POA: Diagnosis not present

## 2021-08-05 DIAGNOSIS — E7800 Pure hypercholesterolemia, unspecified: Secondary | ICD-10-CM | POA: Diagnosis not present

## 2021-08-05 DIAGNOSIS — T466X5A Adverse effect of antihyperlipidemic and antiarteriosclerotic drugs, initial encounter: Secondary | ICD-10-CM | POA: Diagnosis not present

## 2021-08-05 DIAGNOSIS — M810 Age-related osteoporosis without current pathological fracture: Secondary | ICD-10-CM | POA: Diagnosis not present

## 2021-08-05 DIAGNOSIS — M791 Myalgia, unspecified site: Secondary | ICD-10-CM | POA: Diagnosis not present

## 2021-08-05 DIAGNOSIS — E039 Hypothyroidism, unspecified: Secondary | ICD-10-CM | POA: Diagnosis not present

## 2021-08-11 DIAGNOSIS — M810 Age-related osteoporosis without current pathological fracture: Secondary | ICD-10-CM | POA: Diagnosis not present

## 2021-08-28 DIAGNOSIS — R0609 Other forms of dyspnea: Secondary | ICD-10-CM | POA: Diagnosis not present

## 2021-08-28 DIAGNOSIS — E785 Hyperlipidemia, unspecified: Secondary | ICD-10-CM | POA: Diagnosis not present

## 2021-08-28 DIAGNOSIS — R0602 Shortness of breath: Secondary | ICD-10-CM | POA: Diagnosis not present

## 2021-08-28 DIAGNOSIS — N183 Chronic kidney disease, stage 3 unspecified: Secondary | ICD-10-CM | POA: Diagnosis not present

## 2021-08-28 DIAGNOSIS — E875 Hyperkalemia: Secondary | ICD-10-CM | POA: Diagnosis not present

## 2021-08-28 DIAGNOSIS — D631 Anemia in chronic kidney disease: Secondary | ICD-10-CM | POA: Diagnosis not present

## 2021-08-28 DIAGNOSIS — N179 Acute kidney failure, unspecified: Secondary | ICD-10-CM | POA: Diagnosis not present

## 2021-08-28 DIAGNOSIS — N184 Chronic kidney disease, stage 4 (severe): Secondary | ICD-10-CM | POA: Diagnosis not present

## 2021-08-28 DIAGNOSIS — I129 Hypertensive chronic kidney disease with stage 1 through stage 4 chronic kidney disease, or unspecified chronic kidney disease: Secondary | ICD-10-CM | POA: Diagnosis not present

## 2021-08-28 DIAGNOSIS — N2581 Secondary hyperparathyroidism of renal origin: Secondary | ICD-10-CM | POA: Diagnosis not present

## 2021-08-28 DIAGNOSIS — Z87898 Personal history of other specified conditions: Secondary | ICD-10-CM | POA: Diagnosis not present

## 2021-09-02 DIAGNOSIS — R0602 Shortness of breath: Secondary | ICD-10-CM | POA: Diagnosis not present

## 2021-09-24 DIAGNOSIS — J849 Interstitial pulmonary disease, unspecified: Secondary | ICD-10-CM | POA: Diagnosis not present

## 2021-09-24 DIAGNOSIS — Z8616 Personal history of COVID-19: Secondary | ICD-10-CM | POA: Diagnosis not present

## 2021-09-24 DIAGNOSIS — N183 Chronic kidney disease, stage 3 unspecified: Secondary | ICD-10-CM | POA: Diagnosis not present

## 2021-09-24 DIAGNOSIS — I25119 Atherosclerotic heart disease of native coronary artery with unspecified angina pectoris: Secondary | ICD-10-CM | POA: Diagnosis not present

## 2021-09-24 DIAGNOSIS — R9389 Abnormal findings on diagnostic imaging of other specified body structures: Secondary | ICD-10-CM | POA: Diagnosis not present

## 2021-09-24 DIAGNOSIS — J479 Bronchiectasis, uncomplicated: Secondary | ICD-10-CM | POA: Diagnosis not present

## 2021-09-24 DIAGNOSIS — R0602 Shortness of breath: Secondary | ICD-10-CM | POA: Diagnosis not present

## 2021-09-24 DIAGNOSIS — Z952 Presence of prosthetic heart valve: Secondary | ICD-10-CM | POA: Diagnosis not present

## 2021-10-09 DIAGNOSIS — H353132 Nonexudative age-related macular degeneration, bilateral, intermediate dry stage: Secondary | ICD-10-CM | POA: Diagnosis not present

## 2021-10-09 DIAGNOSIS — Z961 Presence of intraocular lens: Secondary | ICD-10-CM | POA: Diagnosis not present

## 2021-10-09 DIAGNOSIS — H35372 Puckering of macula, left eye: Secondary | ICD-10-CM | POA: Diagnosis not present

## 2021-10-09 DIAGNOSIS — H43813 Vitreous degeneration, bilateral: Secondary | ICD-10-CM | POA: Diagnosis not present

## 2021-11-09 DIAGNOSIS — I10 Essential (primary) hypertension: Secondary | ICD-10-CM | POA: Diagnosis not present

## 2021-11-09 DIAGNOSIS — N184 Chronic kidney disease, stage 4 (severe): Secondary | ICD-10-CM | POA: Diagnosis not present

## 2021-11-09 DIAGNOSIS — I509 Heart failure, unspecified: Secondary | ICD-10-CM | POA: Diagnosis not present

## 2021-11-18 DIAGNOSIS — H3530 Unspecified macular degeneration: Secondary | ICD-10-CM | POA: Diagnosis not present

## 2021-11-18 DIAGNOSIS — H53413 Scotoma involving central area, bilateral: Secondary | ICD-10-CM | POA: Diagnosis not present

## 2021-12-15 ENCOUNTER — Ambulatory Visit: Payer: Self-pay | Admitting: Licensed Clinical Social Worker

## 2021-12-15 NOTE — Patient Outreach (Signed)
  Care Coordination   12/15/2021 Name: Jennifer Joyce MRN: [id number] DOB: 1931-04-30   Care Coordination Outreach Attempts:  An unsuccessful telephone outreach was attempted today to offer the patient information about available care coordination services as a benefit of their health plan.   Follow Up Plan:  Additional outreach attempts will be made to offer the patient care coordination information and services.   Encounter Outcome:  No Answer  Care Coordination Interventions Activated:  No   Care Coordination Interventions:  No, not indicated    Lenor Derrick, MSW  Social Worker [facility]  [phone]

## 2022-01-09 DIAGNOSIS — N184 Chronic kidney disease, stage 4 (severe): Secondary | ICD-10-CM | POA: Diagnosis not present

## 2022-01-09 DIAGNOSIS — I10 Essential (primary) hypertension: Secondary | ICD-10-CM | POA: Diagnosis not present

## 2022-01-09 DIAGNOSIS — I509 Heart failure, unspecified: Secondary | ICD-10-CM | POA: Diagnosis not present

## 2022-01-25 DIAGNOSIS — H353133 Nonexudative age-related macular degeneration, bilateral, advanced atrophic without subfoveal involvement: Secondary | ICD-10-CM | POA: Diagnosis not present

## 2022-01-25 DIAGNOSIS — H3122 Choroidal dystrophy (central areolar) (generalized) (peripapillary): Secondary | ICD-10-CM | POA: Diagnosis not present

## 2022-01-25 DIAGNOSIS — H43813 Vitreous degeneration, bilateral: Secondary | ICD-10-CM | POA: Diagnosis not present

## 2022-01-25 DIAGNOSIS — H35363 Drusen (degenerative) of macula, bilateral: Secondary | ICD-10-CM | POA: Diagnosis not present

## 2022-01-25 DIAGNOSIS — Z961 Presence of intraocular lens: Secondary | ICD-10-CM | POA: Diagnosis not present

## 2022-01-25 DIAGNOSIS — H02836 Dermatochalasis of left eye, unspecified eyelid: Secondary | ICD-10-CM | POA: Diagnosis not present

## 2022-02-01 DIAGNOSIS — E039 Hypothyroidism, unspecified: Secondary | ICD-10-CM | POA: Diagnosis not present

## 2022-02-01 DIAGNOSIS — N184 Chronic kidney disease, stage 4 (severe): Secondary | ICD-10-CM | POA: Diagnosis not present

## 2022-02-01 DIAGNOSIS — E7800 Pure hypercholesterolemia, unspecified: Secondary | ICD-10-CM | POA: Diagnosis not present

## 2022-02-04 DIAGNOSIS — M810 Age-related osteoporosis without current pathological fracture: Secondary | ICD-10-CM | POA: Diagnosis not present

## 2022-02-04 DIAGNOSIS — Z9181 History of falling: Secondary | ICD-10-CM | POA: Diagnosis not present

## 2022-02-04 DIAGNOSIS — R6889 Other general symptoms and signs: Secondary | ICD-10-CM | POA: Diagnosis not present

## 2022-02-04 DIAGNOSIS — E559 Vitamin D deficiency, unspecified: Secondary | ICD-10-CM | POA: Diagnosis not present

## 2022-02-04 DIAGNOSIS — Z139 Encounter for screening, unspecified: Secondary | ICD-10-CM | POA: Diagnosis not present

## 2022-02-04 DIAGNOSIS — N1832 Chronic kidney disease, stage 3b: Secondary | ICD-10-CM | POA: Diagnosis not present

## 2022-02-04 DIAGNOSIS — I509 Heart failure, unspecified: Secondary | ICD-10-CM | POA: Diagnosis not present

## 2022-02-04 DIAGNOSIS — E7800 Pure hypercholesterolemia, unspecified: Secondary | ICD-10-CM | POA: Diagnosis not present

## 2022-02-04 DIAGNOSIS — E039 Hypothyroidism, unspecified: Secondary | ICD-10-CM | POA: Diagnosis not present

## 2022-02-04 DIAGNOSIS — J111 Influenza due to unidentified influenza virus with other respiratory manifestations: Secondary | ICD-10-CM | POA: Diagnosis not present

## 2022-02-04 DIAGNOSIS — B351 Tinea unguium: Secondary | ICD-10-CM | POA: Diagnosis not present

## 2022-02-04 DIAGNOSIS — J8410 Pulmonary fibrosis, unspecified: Secondary | ICD-10-CM | POA: Diagnosis not present

## 2022-02-24 DIAGNOSIS — N1832 Chronic kidney disease, stage 3b: Secondary | ICD-10-CM | POA: Diagnosis not present

## 2022-02-24 DIAGNOSIS — Z952 Presence of prosthetic heart valve: Secondary | ICD-10-CM | POA: Diagnosis not present

## 2022-02-24 DIAGNOSIS — I10 Essential (primary) hypertension: Secondary | ICD-10-CM | POA: Diagnosis not present

## 2022-02-24 DIAGNOSIS — Z9889 Other specified postprocedural states: Secondary | ICD-10-CM | POA: Diagnosis not present

## 2022-02-24 DIAGNOSIS — Z8679 Personal history of other diseases of the circulatory system: Secondary | ICD-10-CM | POA: Diagnosis not present

## 2022-02-24 DIAGNOSIS — I951 Orthostatic hypotension: Secondary | ICD-10-CM | POA: Diagnosis not present

## 2022-02-24 DIAGNOSIS — E785 Hyperlipidemia, unspecified: Secondary | ICD-10-CM | POA: Diagnosis not present

## 2022-02-26 DIAGNOSIS — H43813 Vitreous degeneration, bilateral: Secondary | ICD-10-CM | POA: Diagnosis not present

## 2022-02-26 DIAGNOSIS — Z961 Presence of intraocular lens: Secondary | ICD-10-CM | POA: Diagnosis not present

## 2022-02-26 DIAGNOSIS — H353132 Nonexudative age-related macular degeneration, bilateral, intermediate dry stage: Secondary | ICD-10-CM | POA: Diagnosis not present

## 2022-02-26 DIAGNOSIS — H35372 Puckering of macula, left eye: Secondary | ICD-10-CM | POA: Diagnosis not present

## 2022-03-08 DIAGNOSIS — M810 Age-related osteoporosis without current pathological fracture: Secondary | ICD-10-CM | POA: Diagnosis not present

## 2022-03-16 ENCOUNTER — Telehealth: Payer: Self-pay

## 2022-03-16 NOTE — Patient Outreach (Signed)
  Care Coordination   03/16/2022 Name: Jennifer Joyce MRN: [id number] DOB: December 16, 1931   Care Coordination Outreach Attempts:  A second unsuccessful outreach was attempted today to offer the patient with information about available care coordination services as a benefit of their health plan.     Follow Up Plan:  Additional outreach attempts will be made to offer the patient care coordination information and services.   Encounter Outcome:  No Answer   Care Coordination Interventions:  No, not indicated    Tomasa Rand, RN, BSN, [facility] [employer] [phone]

## 2022-04-14 DIAGNOSIS — I2510 Atherosclerotic heart disease of native coronary artery without angina pectoris: Secondary | ICD-10-CM | POA: Diagnosis not present

## 2022-04-14 DIAGNOSIS — R1111 Vomiting without nausea: Secondary | ICD-10-CM | POA: Diagnosis not present

## 2022-04-14 DIAGNOSIS — J470 Bronchiectasis with acute lower respiratory infection: Secondary | ICD-10-CM | POA: Diagnosis not present

## 2022-04-14 DIAGNOSIS — R531 Weakness: Secondary | ICD-10-CM | POA: Diagnosis not present

## 2022-04-14 DIAGNOSIS — N179 Acute kidney failure, unspecified: Secondary | ICD-10-CM | POA: Diagnosis not present

## 2022-04-14 DIAGNOSIS — F419 Anxiety disorder, unspecified: Secondary | ICD-10-CM | POA: Diagnosis not present

## 2022-04-14 DIAGNOSIS — R109 Unspecified abdominal pain: Secondary | ICD-10-CM | POA: Diagnosis not present

## 2022-04-14 DIAGNOSIS — I4719 Other supraventricular tachycardia: Secondary | ICD-10-CM | POA: Diagnosis not present

## 2022-04-14 DIAGNOSIS — R410 Disorientation, unspecified: Secondary | ICD-10-CM | POA: Diagnosis not present

## 2022-04-14 DIAGNOSIS — E039 Hypothyroidism, unspecified: Secondary | ICD-10-CM | POA: Diagnosis not present

## 2022-04-14 DIAGNOSIS — Z951 Presence of aortocoronary bypass graft: Secondary | ICD-10-CM | POA: Diagnosis not present

## 2022-04-14 DIAGNOSIS — R059 Cough, unspecified: Secondary | ICD-10-CM | POA: Diagnosis not present

## 2022-04-14 DIAGNOSIS — H269 Unspecified cataract: Secondary | ICD-10-CM | POA: Diagnosis not present

## 2022-04-14 DIAGNOSIS — D703 Neutropenia due to infection: Secondary | ICD-10-CM | POA: Diagnosis not present

## 2022-04-14 DIAGNOSIS — J159 Unspecified bacterial pneumonia: Secondary | ICD-10-CM | POA: Diagnosis not present

## 2022-04-14 DIAGNOSIS — J90 Pleural effusion, not elsewhere classified: Secondary | ICD-10-CM | POA: Diagnosis not present

## 2022-04-14 DIAGNOSIS — R079 Chest pain, unspecified: Secondary | ICD-10-CM | POA: Diagnosis not present

## 2022-04-14 DIAGNOSIS — K8020 Calculus of gallbladder without cholecystitis without obstruction: Secondary | ICD-10-CM | POA: Diagnosis not present

## 2022-04-14 DIAGNOSIS — Z8249 Family history of ischemic heart disease and other diseases of the circulatory system: Secondary | ICD-10-CM | POA: Diagnosis not present

## 2022-04-14 DIAGNOSIS — I5031 Acute diastolic (congestive) heart failure: Secondary | ICD-10-CM | POA: Diagnosis not present

## 2022-04-14 DIAGNOSIS — R6520 Severe sepsis without septic shock: Secondary | ICD-10-CM | POA: Diagnosis not present

## 2022-04-14 DIAGNOSIS — N183 Chronic kidney disease, stage 3 unspecified: Secondary | ICD-10-CM | POA: Diagnosis not present

## 2022-04-14 DIAGNOSIS — I700 Atherosclerosis of aorta: Secondary | ICD-10-CM | POA: Diagnosis not present

## 2022-04-14 DIAGNOSIS — J181 Lobar pneumonia, unspecified organism: Secondary | ICD-10-CM | POA: Diagnosis not present

## 2022-04-14 DIAGNOSIS — J439 Emphysema, unspecified: Secondary | ICD-10-CM | POA: Diagnosis not present

## 2022-04-14 DIAGNOSIS — J9621 Acute and chronic respiratory failure with hypoxia: Secondary | ICD-10-CM | POA: Diagnosis not present

## 2022-04-14 DIAGNOSIS — J9611 Chronic respiratory failure with hypoxia: Secondary | ICD-10-CM | POA: Diagnosis not present

## 2022-04-14 DIAGNOSIS — D72810 Lymphocytopenia: Secondary | ICD-10-CM | POA: Diagnosis not present

## 2022-04-14 DIAGNOSIS — Z20822 Contact with and (suspected) exposure to covid-19: Secondary | ICD-10-CM | POA: Diagnosis not present

## 2022-04-14 DIAGNOSIS — E538 Deficiency of other specified B group vitamins: Secondary | ICD-10-CM | POA: Diagnosis not present

## 2022-04-14 DIAGNOSIS — I129 Hypertensive chronic kidney disease with stage 1 through stage 4 chronic kidney disease, or unspecified chronic kidney disease: Secondary | ICD-10-CM | POA: Diagnosis not present

## 2022-04-14 DIAGNOSIS — E559 Vitamin D deficiency, unspecified: Secondary | ICD-10-CM | POA: Diagnosis not present

## 2022-04-14 DIAGNOSIS — N135 Crossing vessel and stricture of ureter without hydronephrosis: Secondary | ICD-10-CM | POA: Diagnosis not present

## 2022-04-14 DIAGNOSIS — J189 Pneumonia, unspecified organism: Secondary | ICD-10-CM | POA: Diagnosis not present

## 2022-04-14 DIAGNOSIS — R630 Anorexia: Secondary | ICD-10-CM | POA: Diagnosis not present

## 2022-04-14 DIAGNOSIS — Z953 Presence of xenogenic heart valve: Secondary | ICD-10-CM | POA: Diagnosis not present

## 2022-04-14 DIAGNOSIS — Z952 Presence of prosthetic heart valve: Secondary | ICD-10-CM | POA: Diagnosis not present

## 2022-04-14 DIAGNOSIS — R0781 Pleurodynia: Secondary | ICD-10-CM | POA: Diagnosis not present

## 2022-04-14 DIAGNOSIS — E785 Hyperlipidemia, unspecified: Secondary | ICD-10-CM | POA: Diagnosis not present

## 2022-04-14 DIAGNOSIS — E860 Dehydration: Secondary | ICD-10-CM | POA: Diagnosis not present

## 2022-04-14 DIAGNOSIS — I491 Atrial premature depolarization: Secondary | ICD-10-CM | POA: Diagnosis not present

## 2022-04-14 DIAGNOSIS — N189 Chronic kidney disease, unspecified: Secondary | ICD-10-CM | POA: Diagnosis not present

## 2022-04-14 DIAGNOSIS — N132 Hydronephrosis with renal and ureteral calculous obstruction: Secondary | ICD-10-CM | POA: Diagnosis not present

## 2022-04-14 DIAGNOSIS — Z8041 Family history of malignant neoplasm of ovary: Secondary | ICD-10-CM | POA: Diagnosis not present

## 2022-04-14 DIAGNOSIS — R110 Nausea: Secondary | ICD-10-CM | POA: Diagnosis not present

## 2022-04-14 DIAGNOSIS — I5030 Unspecified diastolic (congestive) heart failure: Secondary | ICD-10-CM | POA: Diagnosis not present

## 2022-04-14 DIAGNOSIS — R112 Nausea with vomiting, unspecified: Secondary | ICD-10-CM | POA: Diagnosis not present

## 2022-04-14 DIAGNOSIS — R0902 Hypoxemia: Secondary | ICD-10-CM | POA: Diagnosis not present

## 2022-04-14 DIAGNOSIS — Z743 Need for continuous supervision: Secondary | ICD-10-CM | POA: Diagnosis not present

## 2022-04-14 DIAGNOSIS — H353190 Nonexudative age-related macular degeneration, unspecified eye, stage unspecified: Secondary | ICD-10-CM | POA: Diagnosis not present

## 2022-04-14 DIAGNOSIS — J479 Bronchiectasis, uncomplicated: Secondary | ICD-10-CM | POA: Diagnosis not present

## 2022-04-14 DIAGNOSIS — F32A Depression, unspecified: Secondary | ICD-10-CM | POA: Diagnosis not present

## 2022-04-14 DIAGNOSIS — I471 Supraventricular tachycardia, unspecified: Secondary | ICD-10-CM | POA: Diagnosis not present

## 2022-04-14 DIAGNOSIS — A419 Sepsis, unspecified organism: Secondary | ICD-10-CM | POA: Diagnosis not present

## 2022-04-14 DIAGNOSIS — Z8616 Personal history of COVID-19: Secondary | ICD-10-CM | POA: Diagnosis not present

## 2022-04-14 DIAGNOSIS — I10 Essential (primary) hypertension: Secondary | ICD-10-CM | POA: Diagnosis not present

## 2022-04-14 DIAGNOSIS — R0602 Shortness of breath: Secondary | ICD-10-CM | POA: Diagnosis not present

## 2022-04-14 DIAGNOSIS — R1011 Right upper quadrant pain: Secondary | ICD-10-CM | POA: Diagnosis not present

## 2022-04-14 DIAGNOSIS — Z954 Presence of other heart-valve replacement: Secondary | ICD-10-CM | POA: Diagnosis not present

## 2022-04-22 DIAGNOSIS — F339 Major depressive disorder, recurrent, unspecified: Secondary | ICD-10-CM | POA: Diagnosis not present

## 2022-04-22 DIAGNOSIS — D72810 Lymphocytopenia: Secondary | ICD-10-CM | POA: Diagnosis not present

## 2022-04-22 DIAGNOSIS — F419 Anxiety disorder, unspecified: Secondary | ICD-10-CM | POA: Diagnosis not present

## 2022-04-22 DIAGNOSIS — R630 Anorexia: Secondary | ICD-10-CM | POA: Diagnosis not present

## 2022-04-22 DIAGNOSIS — F32A Depression, unspecified: Secondary | ICD-10-CM | POA: Diagnosis not present

## 2022-04-22 DIAGNOSIS — N1832 Chronic kidney disease, stage 3b: Secondary | ICD-10-CM | POA: Diagnosis not present

## 2022-04-22 DIAGNOSIS — R531 Weakness: Secondary | ICD-10-CM | POA: Diagnosis not present

## 2022-04-22 DIAGNOSIS — I471 Supraventricular tachycardia, unspecified: Secondary | ICD-10-CM | POA: Diagnosis not present

## 2022-04-22 DIAGNOSIS — J159 Unspecified bacterial pneumonia: Secondary | ICD-10-CM | POA: Diagnosis not present

## 2022-04-22 DIAGNOSIS — E538 Deficiency of other specified B group vitamins: Secondary | ICD-10-CM | POA: Diagnosis not present

## 2022-04-22 DIAGNOSIS — A419 Sepsis, unspecified organism: Secondary | ICD-10-CM | POA: Diagnosis not present

## 2022-04-22 DIAGNOSIS — E039 Hypothyroidism, unspecified: Secondary | ICD-10-CM | POA: Diagnosis not present

## 2022-04-22 DIAGNOSIS — R410 Disorientation, unspecified: Secondary | ICD-10-CM | POA: Diagnosis not present

## 2022-04-22 DIAGNOSIS — H3530 Unspecified macular degeneration: Secondary | ICD-10-CM | POA: Diagnosis not present

## 2022-04-22 DIAGNOSIS — J189 Pneumonia, unspecified organism: Secondary | ICD-10-CM | POA: Diagnosis not present

## 2022-04-22 DIAGNOSIS — R079 Chest pain, unspecified: Secondary | ICD-10-CM | POA: Diagnosis not present

## 2022-04-22 DIAGNOSIS — H269 Unspecified cataract: Secondary | ICD-10-CM | POA: Diagnosis not present

## 2022-04-22 DIAGNOSIS — N179 Acute kidney failure, unspecified: Secondary | ICD-10-CM | POA: Diagnosis not present

## 2022-04-22 DIAGNOSIS — H353190 Nonexudative age-related macular degeneration, unspecified eye, stage unspecified: Secondary | ICD-10-CM | POA: Diagnosis not present

## 2022-04-22 DIAGNOSIS — I2510 Atherosclerotic heart disease of native coronary artery without angina pectoris: Secondary | ICD-10-CM | POA: Diagnosis not present

## 2022-04-22 DIAGNOSIS — J479 Bronchiectasis, uncomplicated: Secondary | ICD-10-CM | POA: Diagnosis not present

## 2022-04-22 DIAGNOSIS — R1011 Right upper quadrant pain: Secondary | ICD-10-CM | POA: Diagnosis not present

## 2022-04-22 DIAGNOSIS — Z952 Presence of prosthetic heart valve: Secondary | ICD-10-CM | POA: Diagnosis not present

## 2022-04-22 DIAGNOSIS — Z743 Need for continuous supervision: Secondary | ICD-10-CM | POA: Diagnosis not present

## 2022-04-22 DIAGNOSIS — N183 Chronic kidney disease, stage 3 unspecified: Secondary | ICD-10-CM | POA: Diagnosis not present

## 2022-04-22 DIAGNOSIS — J9611 Chronic respiratory failure with hypoxia: Secondary | ICD-10-CM | POA: Diagnosis not present

## 2022-04-22 DIAGNOSIS — E559 Vitamin D deficiency, unspecified: Secondary | ICD-10-CM | POA: Diagnosis not present

## 2022-04-22 DIAGNOSIS — E785 Hyperlipidemia, unspecified: Secondary | ICD-10-CM | POA: Diagnosis not present

## 2022-04-22 DIAGNOSIS — I5030 Unspecified diastolic (congestive) heart failure: Secondary | ICD-10-CM | POA: Diagnosis not present

## 2022-04-22 DIAGNOSIS — J181 Lobar pneumonia, unspecified organism: Secondary | ICD-10-CM | POA: Diagnosis not present

## 2022-04-22 DIAGNOSIS — I129 Hypertensive chronic kidney disease with stage 1 through stage 4 chronic kidney disease, or unspecified chronic kidney disease: Secondary | ICD-10-CM | POA: Diagnosis not present

## 2022-04-22 DIAGNOSIS — R0781 Pleurodynia: Secondary | ICD-10-CM | POA: Diagnosis not present

## 2022-04-23 DIAGNOSIS — J189 Pneumonia, unspecified organism: Secondary | ICD-10-CM | POA: Diagnosis not present

## 2022-04-23 DIAGNOSIS — H3530 Unspecified macular degeneration: Secondary | ICD-10-CM | POA: Diagnosis not present

## 2022-04-23 DIAGNOSIS — F339 Major depressive disorder, recurrent, unspecified: Secondary | ICD-10-CM | POA: Diagnosis not present

## 2022-04-23 DIAGNOSIS — R531 Weakness: Secondary | ICD-10-CM | POA: Diagnosis not present

## 2022-04-23 DIAGNOSIS — I2510 Atherosclerotic heart disease of native coronary artery without angina pectoris: Secondary | ICD-10-CM | POA: Diagnosis not present

## 2022-04-23 DIAGNOSIS — N179 Acute kidney failure, unspecified: Secondary | ICD-10-CM | POA: Diagnosis not present

## 2022-04-23 DIAGNOSIS — E039 Hypothyroidism, unspecified: Secondary | ICD-10-CM | POA: Diagnosis not present

## 2022-04-23 DIAGNOSIS — N1832 Chronic kidney disease, stage 3b: Secondary | ICD-10-CM | POA: Diagnosis not present

## 2022-05-14 DIAGNOSIS — E538 Deficiency of other specified B group vitamins: Secondary | ICD-10-CM | POA: Diagnosis not present

## 2022-05-14 DIAGNOSIS — M810 Age-related osteoporosis without current pathological fracture: Secondary | ICD-10-CM | POA: Diagnosis not present

## 2022-05-14 DIAGNOSIS — F329 Major depressive disorder, single episode, unspecified: Secondary | ICD-10-CM | POA: Diagnosis not present

## 2022-05-14 DIAGNOSIS — E039 Hypothyroidism, unspecified: Secondary | ICD-10-CM | POA: Diagnosis not present

## 2022-05-14 DIAGNOSIS — Z8616 Personal history of COVID-19: Secondary | ICD-10-CM | POA: Diagnosis not present

## 2022-05-14 DIAGNOSIS — Z952 Presence of prosthetic heart valve: Secondary | ICD-10-CM | POA: Diagnosis not present

## 2022-05-14 DIAGNOSIS — I5030 Unspecified diastolic (congestive) heart failure: Secondary | ICD-10-CM | POA: Diagnosis not present

## 2022-05-14 DIAGNOSIS — Z955 Presence of coronary angioplasty implant and graft: Secondary | ICD-10-CM | POA: Diagnosis not present

## 2022-05-14 DIAGNOSIS — J9621 Acute and chronic respiratory failure with hypoxia: Secondary | ICD-10-CM | POA: Diagnosis not present

## 2022-05-14 DIAGNOSIS — I130 Hypertensive heart and chronic kidney disease with heart failure and stage 1 through stage 4 chronic kidney disease, or unspecified chronic kidney disease: Secondary | ICD-10-CM | POA: Diagnosis not present

## 2022-05-14 DIAGNOSIS — N183 Chronic kidney disease, stage 3 unspecified: Secondary | ICD-10-CM | POA: Diagnosis not present

## 2022-05-14 DIAGNOSIS — J189 Pneumonia, unspecified organism: Secondary | ICD-10-CM | POA: Diagnosis not present

## 2022-05-14 DIAGNOSIS — Z7982 Long term (current) use of aspirin: Secondary | ICD-10-CM | POA: Diagnosis not present

## 2022-05-14 DIAGNOSIS — J479 Bronchiectasis, uncomplicated: Secondary | ICD-10-CM | POA: Diagnosis not present

## 2022-05-14 DIAGNOSIS — D509 Iron deficiency anemia, unspecified: Secondary | ICD-10-CM | POA: Diagnosis not present

## 2022-05-14 DIAGNOSIS — Z951 Presence of aortocoronary bypass graft: Secondary | ICD-10-CM | POA: Diagnosis not present

## 2022-05-14 DIAGNOSIS — Z556 Problems related to health literacy: Secondary | ICD-10-CM | POA: Diagnosis not present

## 2022-05-14 DIAGNOSIS — F419 Anxiety disorder, unspecified: Secondary | ICD-10-CM | POA: Diagnosis not present

## 2022-05-14 DIAGNOSIS — I2510 Atherosclerotic heart disease of native coronary artery without angina pectoris: Secondary | ICD-10-CM | POA: Diagnosis not present

## 2022-05-20 DIAGNOSIS — I2510 Atherosclerotic heart disease of native coronary artery without angina pectoris: Secondary | ICD-10-CM | POA: Diagnosis not present

## 2022-05-20 DIAGNOSIS — Z8616 Personal history of COVID-19: Secondary | ICD-10-CM | POA: Diagnosis not present

## 2022-05-20 DIAGNOSIS — J90 Pleural effusion, not elsewhere classified: Secondary | ICD-10-CM | POA: Diagnosis not present

## 2022-05-20 DIAGNOSIS — R9389 Abnormal findings on diagnostic imaging of other specified body structures: Secondary | ICD-10-CM | POA: Diagnosis not present

## 2022-05-20 DIAGNOSIS — J479 Bronchiectasis, uncomplicated: Secondary | ICD-10-CM | POA: Diagnosis not present

## 2022-05-21 DIAGNOSIS — J9621 Acute and chronic respiratory failure with hypoxia: Secondary | ICD-10-CM | POA: Diagnosis not present

## 2022-05-21 DIAGNOSIS — N183 Chronic kidney disease, stage 3 unspecified: Secondary | ICD-10-CM | POA: Diagnosis not present

## 2022-05-21 DIAGNOSIS — J189 Pneumonia, unspecified organism: Secondary | ICD-10-CM | POA: Diagnosis not present

## 2022-05-21 DIAGNOSIS — I5030 Unspecified diastolic (congestive) heart failure: Secondary | ICD-10-CM | POA: Diagnosis not present

## 2022-05-21 DIAGNOSIS — I130 Hypertensive heart and chronic kidney disease with heart failure and stage 1 through stage 4 chronic kidney disease, or unspecified chronic kidney disease: Secondary | ICD-10-CM | POA: Diagnosis not present

## 2022-05-21 DIAGNOSIS — J479 Bronchiectasis, uncomplicated: Secondary | ICD-10-CM | POA: Diagnosis not present

## 2022-05-24 DIAGNOSIS — J479 Bronchiectasis, uncomplicated: Secondary | ICD-10-CM | POA: Diagnosis not present

## 2022-05-24 DIAGNOSIS — J189 Pneumonia, unspecified organism: Secondary | ICD-10-CM | POA: Diagnosis not present

## 2022-05-24 DIAGNOSIS — J9621 Acute and chronic respiratory failure with hypoxia: Secondary | ICD-10-CM | POA: Diagnosis not present

## 2022-05-24 DIAGNOSIS — N183 Chronic kidney disease, stage 3 unspecified: Secondary | ICD-10-CM | POA: Diagnosis not present

## 2022-05-24 DIAGNOSIS — I130 Hypertensive heart and chronic kidney disease with heart failure and stage 1 through stage 4 chronic kidney disease, or unspecified chronic kidney disease: Secondary | ICD-10-CM | POA: Diagnosis not present

## 2022-05-24 DIAGNOSIS — I5030 Unspecified diastolic (congestive) heart failure: Secondary | ICD-10-CM | POA: Diagnosis not present

## 2022-05-29 DIAGNOSIS — E785 Hyperlipidemia, unspecified: Secondary | ICD-10-CM | POA: Diagnosis not present

## 2022-05-29 DIAGNOSIS — N135 Crossing vessel and stricture of ureter without hydronephrosis: Secondary | ICD-10-CM | POA: Diagnosis not present

## 2022-05-29 DIAGNOSIS — F339 Major depressive disorder, recurrent, unspecified: Secondary | ICD-10-CM | POA: Diagnosis not present

## 2022-05-29 DIAGNOSIS — D72810 Lymphocytopenia: Secondary | ICD-10-CM | POA: Diagnosis not present

## 2022-05-29 DIAGNOSIS — J9611 Chronic respiratory failure with hypoxia: Secondary | ICD-10-CM | POA: Diagnosis not present

## 2022-05-29 DIAGNOSIS — E039 Hypothyroidism, unspecified: Secondary | ICD-10-CM | POA: Diagnosis not present

## 2022-05-29 DIAGNOSIS — J479 Bronchiectasis, uncomplicated: Secondary | ICD-10-CM | POA: Diagnosis not present

## 2022-05-29 DIAGNOSIS — H35369 Drusen (degenerative) of macula, unspecified eye: Secondary | ICD-10-CM | POA: Diagnosis not present

## 2022-05-29 DIAGNOSIS — F419 Anxiety disorder, unspecified: Secondary | ICD-10-CM | POA: Diagnosis not present

## 2022-05-29 DIAGNOSIS — I509 Heart failure, unspecified: Secondary | ICD-10-CM | POA: Diagnosis not present

## 2022-05-29 DIAGNOSIS — E875 Hyperkalemia: Secondary | ICD-10-CM | POA: Diagnosis not present

## 2022-05-29 DIAGNOSIS — I471 Supraventricular tachycardia, unspecified: Secondary | ICD-10-CM | POA: Diagnosis not present

## 2022-05-29 DIAGNOSIS — N1832 Chronic kidney disease, stage 3b: Secondary | ICD-10-CM | POA: Diagnosis not present

## 2022-05-29 DIAGNOSIS — H35372 Puckering of macula, left eye: Secondary | ICD-10-CM | POA: Diagnosis not present

## 2022-05-29 DIAGNOSIS — I2510 Atherosclerotic heart disease of native coronary artery without angina pectoris: Secondary | ICD-10-CM | POA: Diagnosis not present

## 2022-05-29 DIAGNOSIS — E538 Deficiency of other specified B group vitamins: Secondary | ICD-10-CM | POA: Diagnosis not present

## 2022-05-29 DIAGNOSIS — I5189 Other ill-defined heart diseases: Secondary | ICD-10-CM | POA: Diagnosis not present

## 2022-05-29 DIAGNOSIS — Z952 Presence of prosthetic heart valve: Secondary | ICD-10-CM | POA: Diagnosis not present

## 2022-05-29 DIAGNOSIS — M1990 Unspecified osteoarthritis, unspecified site: Secondary | ICD-10-CM | POA: Diagnosis not present

## 2022-05-29 DIAGNOSIS — H5213 Myopia, bilateral: Secondary | ICD-10-CM | POA: Diagnosis not present

## 2022-05-29 DIAGNOSIS — D509 Iron deficiency anemia, unspecified: Secondary | ICD-10-CM | POA: Diagnosis not present

## 2022-05-29 DIAGNOSIS — H43813 Vitreous degeneration, bilateral: Secondary | ICD-10-CM | POA: Diagnosis not present

## 2022-05-29 DIAGNOSIS — E041 Nontoxic single thyroid nodule: Secondary | ICD-10-CM | POA: Diagnosis not present

## 2022-05-29 DIAGNOSIS — Q6239 Other obstructive defects of renal pelvis and ureter: Secondary | ICD-10-CM | POA: Diagnosis not present

## 2022-05-29 DIAGNOSIS — H353132 Nonexudative age-related macular degeneration, bilateral, intermediate dry stage: Secondary | ICD-10-CM | POA: Diagnosis not present

## 2022-06-02 DIAGNOSIS — N1832 Chronic kidney disease, stage 3b: Secondary | ICD-10-CM | POA: Diagnosis not present

## 2022-06-02 DIAGNOSIS — J9611 Chronic respiratory failure with hypoxia: Secondary | ICD-10-CM | POA: Diagnosis not present

## 2022-06-02 DIAGNOSIS — M1990 Unspecified osteoarthritis, unspecified site: Secondary | ICD-10-CM | POA: Diagnosis not present

## 2022-06-02 DIAGNOSIS — I509 Heart failure, unspecified: Secondary | ICD-10-CM | POA: Diagnosis not present

## 2022-06-02 DIAGNOSIS — F339 Major depressive disorder, recurrent, unspecified: Secondary | ICD-10-CM | POA: Diagnosis not present

## 2022-06-02 DIAGNOSIS — J479 Bronchiectasis, uncomplicated: Secondary | ICD-10-CM | POA: Diagnosis not present

## 2022-06-04 DIAGNOSIS — N390 Urinary tract infection, site not specified: Secondary | ICD-10-CM | POA: Diagnosis not present

## 2022-06-04 DIAGNOSIS — R300 Dysuria: Secondary | ICD-10-CM | POA: Diagnosis not present

## 2022-06-04 DIAGNOSIS — E785 Hyperlipidemia, unspecified: Secondary | ICD-10-CM | POA: Diagnosis not present

## 2022-06-04 DIAGNOSIS — I10 Essential (primary) hypertension: Secondary | ICD-10-CM | POA: Diagnosis not present

## 2022-06-07 DIAGNOSIS — N1832 Chronic kidney disease, stage 3b: Secondary | ICD-10-CM | POA: Diagnosis not present

## 2022-06-07 DIAGNOSIS — J9611 Chronic respiratory failure with hypoxia: Secondary | ICD-10-CM | POA: Diagnosis not present

## 2022-06-07 DIAGNOSIS — I509 Heart failure, unspecified: Secondary | ICD-10-CM | POA: Diagnosis not present

## 2022-06-07 DIAGNOSIS — J479 Bronchiectasis, uncomplicated: Secondary | ICD-10-CM | POA: Diagnosis not present

## 2022-06-07 DIAGNOSIS — F339 Major depressive disorder, recurrent, unspecified: Secondary | ICD-10-CM | POA: Diagnosis not present

## 2022-06-07 DIAGNOSIS — M1990 Unspecified osteoarthritis, unspecified site: Secondary | ICD-10-CM | POA: Diagnosis not present

## 2022-06-08 DIAGNOSIS — J479 Bronchiectasis, uncomplicated: Secondary | ICD-10-CM | POA: Diagnosis not present

## 2022-06-08 DIAGNOSIS — J9611 Chronic respiratory failure with hypoxia: Secondary | ICD-10-CM | POA: Diagnosis not present

## 2022-06-08 DIAGNOSIS — F339 Major depressive disorder, recurrent, unspecified: Secondary | ICD-10-CM | POA: Diagnosis not present

## 2022-06-08 DIAGNOSIS — N1832 Chronic kidney disease, stage 3b: Secondary | ICD-10-CM | POA: Diagnosis not present

## 2022-06-08 DIAGNOSIS — I509 Heart failure, unspecified: Secondary | ICD-10-CM | POA: Diagnosis not present

## 2022-06-08 DIAGNOSIS — M1990 Unspecified osteoarthritis, unspecified site: Secondary | ICD-10-CM | POA: Diagnosis not present

## 2022-06-09 DIAGNOSIS — I509 Heart failure, unspecified: Secondary | ICD-10-CM | POA: Diagnosis not present

## 2022-06-09 DIAGNOSIS — N1832 Chronic kidney disease, stage 3b: Secondary | ICD-10-CM | POA: Diagnosis not present

## 2022-06-09 DIAGNOSIS — M1990 Unspecified osteoarthritis, unspecified site: Secondary | ICD-10-CM | POA: Diagnosis not present

## 2022-06-09 DIAGNOSIS — F339 Major depressive disorder, recurrent, unspecified: Secondary | ICD-10-CM | POA: Diagnosis not present

## 2022-06-09 DIAGNOSIS — J479 Bronchiectasis, uncomplicated: Secondary | ICD-10-CM | POA: Diagnosis not present

## 2022-06-09 DIAGNOSIS — J9611 Chronic respiratory failure with hypoxia: Secondary | ICD-10-CM | POA: Diagnosis not present

## 2022-06-09 DIAGNOSIS — I130 Hypertensive heart and chronic kidney disease with heart failure and stage 1 through stage 4 chronic kidney disease, or unspecified chronic kidney disease: Secondary | ICD-10-CM | POA: Diagnosis not present

## 2022-06-10 DIAGNOSIS — I130 Hypertensive heart and chronic kidney disease with heart failure and stage 1 through stage 4 chronic kidney disease, or unspecified chronic kidney disease: Secondary | ICD-10-CM | POA: Diagnosis not present

## 2022-06-10 DIAGNOSIS — J189 Pneumonia, unspecified organism: Secondary | ICD-10-CM | POA: Diagnosis not present

## 2022-06-10 DIAGNOSIS — I5030 Unspecified diastolic (congestive) heart failure: Secondary | ICD-10-CM | POA: Diagnosis not present

## 2022-06-14 DIAGNOSIS — R102 Pelvic and perineal pain: Secondary | ICD-10-CM | POA: Diagnosis not present

## 2022-06-14 DIAGNOSIS — Z6825 Body mass index (BMI) 25.0-25.9, adult: Secondary | ICD-10-CM | POA: Diagnosis not present

## 2022-06-14 DIAGNOSIS — N1832 Chronic kidney disease, stage 3b: Secondary | ICD-10-CM | POA: Diagnosis not present

## 2022-06-14 DIAGNOSIS — Z8701 Personal history of pneumonia (recurrent): Secondary | ICD-10-CM | POA: Diagnosis not present

## 2022-06-14 DIAGNOSIS — K219 Gastro-esophageal reflux disease without esophagitis: Secondary | ICD-10-CM | POA: Diagnosis not present

## 2022-06-14 DIAGNOSIS — J8410 Pulmonary fibrosis, unspecified: Secondary | ICD-10-CM | POA: Diagnosis not present

## 2022-06-14 DIAGNOSIS — Z23 Encounter for immunization: Secondary | ICD-10-CM | POA: Diagnosis not present

## 2022-06-14 DIAGNOSIS — I5020 Unspecified systolic (congestive) heart failure: Secondary | ICD-10-CM | POA: Diagnosis not present

## 2022-06-17 DIAGNOSIS — J9611 Chronic respiratory failure with hypoxia: Secondary | ICD-10-CM | POA: Diagnosis not present

## 2022-06-17 DIAGNOSIS — N1832 Chronic kidney disease, stage 3b: Secondary | ICD-10-CM | POA: Diagnosis not present

## 2022-06-17 DIAGNOSIS — J479 Bronchiectasis, uncomplicated: Secondary | ICD-10-CM | POA: Diagnosis not present

## 2022-06-17 DIAGNOSIS — I509 Heart failure, unspecified: Secondary | ICD-10-CM | POA: Diagnosis not present

## 2022-06-17 DIAGNOSIS — F339 Major depressive disorder, recurrent, unspecified: Secondary | ICD-10-CM | POA: Diagnosis not present

## 2022-06-17 DIAGNOSIS — M1990 Unspecified osteoarthritis, unspecified site: Secondary | ICD-10-CM | POA: Diagnosis not present

## 2022-06-22 DIAGNOSIS — I509 Heart failure, unspecified: Secondary | ICD-10-CM | POA: Diagnosis not present

## 2022-06-22 DIAGNOSIS — J9611 Chronic respiratory failure with hypoxia: Secondary | ICD-10-CM | POA: Diagnosis not present

## 2022-06-22 DIAGNOSIS — M1990 Unspecified osteoarthritis, unspecified site: Secondary | ICD-10-CM | POA: Diagnosis not present

## 2022-06-22 DIAGNOSIS — N1832 Chronic kidney disease, stage 3b: Secondary | ICD-10-CM | POA: Diagnosis not present

## 2022-06-22 DIAGNOSIS — J479 Bronchiectasis, uncomplicated: Secondary | ICD-10-CM | POA: Diagnosis not present

## 2022-06-22 DIAGNOSIS — F339 Major depressive disorder, recurrent, unspecified: Secondary | ICD-10-CM | POA: Diagnosis not present

## 2022-06-29 DIAGNOSIS — H35372 Puckering of macula, left eye: Secondary | ICD-10-CM | POA: Diagnosis not present

## 2022-06-29 DIAGNOSIS — Z961 Presence of intraocular lens: Secondary | ICD-10-CM | POA: Diagnosis not present

## 2022-06-29 DIAGNOSIS — H43813 Vitreous degeneration, bilateral: Secondary | ICD-10-CM | POA: Diagnosis not present

## 2022-06-29 DIAGNOSIS — H353132 Nonexudative age-related macular degeneration, bilateral, intermediate dry stage: Secondary | ICD-10-CM | POA: Diagnosis not present

## 2022-06-30 DIAGNOSIS — K219 Gastro-esophageal reflux disease without esophagitis: Secondary | ICD-10-CM | POA: Diagnosis not present

## 2022-06-30 DIAGNOSIS — R112 Nausea with vomiting, unspecified: Secondary | ICD-10-CM | POA: Diagnosis not present

## 2022-08-03 DIAGNOSIS — E7800 Pure hypercholesterolemia, unspecified: Secondary | ICD-10-CM | POA: Diagnosis not present

## 2022-08-03 DIAGNOSIS — N1832 Chronic kidney disease, stage 3b: Secondary | ICD-10-CM | POA: Diagnosis not present

## 2022-08-03 DIAGNOSIS — E039 Hypothyroidism, unspecified: Secondary | ICD-10-CM | POA: Diagnosis not present

## 2022-08-06 DIAGNOSIS — J8410 Pulmonary fibrosis, unspecified: Secondary | ICD-10-CM | POA: Diagnosis not present

## 2022-08-06 DIAGNOSIS — I5020 Unspecified systolic (congestive) heart failure: Secondary | ICD-10-CM | POA: Diagnosis not present

## 2022-08-06 DIAGNOSIS — E7800 Pure hypercholesterolemia, unspecified: Secondary | ICD-10-CM | POA: Diagnosis not present

## 2022-08-06 DIAGNOSIS — E039 Hypothyroidism, unspecified: Secondary | ICD-10-CM | POA: Diagnosis not present

## 2022-08-06 DIAGNOSIS — M810 Age-related osteoporosis without current pathological fracture: Secondary | ICD-10-CM | POA: Diagnosis not present

## 2022-08-06 DIAGNOSIS — N184 Chronic kidney disease, stage 4 (severe): Secondary | ICD-10-CM | POA: Diagnosis not present

## 2022-08-06 DIAGNOSIS — Z6825 Body mass index (BMI) 25.0-25.9, adult: Secondary | ICD-10-CM | POA: Diagnosis not present

## 2022-08-06 DIAGNOSIS — E559 Vitamin D deficiency, unspecified: Secondary | ICD-10-CM | POA: Diagnosis not present

## 2022-08-16 DIAGNOSIS — I25118 Atherosclerotic heart disease of native coronary artery with other forms of angina pectoris: Secondary | ICD-10-CM | POA: Diagnosis not present

## 2022-08-16 DIAGNOSIS — Z952 Presence of prosthetic heart valve: Secondary | ICD-10-CM | POA: Diagnosis not present

## 2022-08-16 DIAGNOSIS — R0602 Shortness of breath: Secondary | ICD-10-CM | POA: Diagnosis not present

## 2022-09-13 DIAGNOSIS — M810 Age-related osteoporosis without current pathological fracture: Secondary | ICD-10-CM | POA: Diagnosis not present

## 2022-11-11 DIAGNOSIS — I371 Nonrheumatic pulmonary valve insufficiency: Secondary | ICD-10-CM | POA: Diagnosis not present

## 2022-11-11 DIAGNOSIS — Z952 Presence of prosthetic heart valve: Secondary | ICD-10-CM | POA: Diagnosis not present

## 2022-11-11 DIAGNOSIS — I517 Cardiomegaly: Secondary | ICD-10-CM | POA: Diagnosis not present

## 2022-11-11 DIAGNOSIS — I071 Rheumatic tricuspid insufficiency: Secondary | ICD-10-CM | POA: Diagnosis not present

## 2022-11-11 DIAGNOSIS — I340 Nonrheumatic mitral (valve) insufficiency: Secondary | ICD-10-CM | POA: Diagnosis not present

## 2022-11-17 DIAGNOSIS — Z952 Presence of prosthetic heart valve: Secondary | ICD-10-CM | POA: Diagnosis not present

## 2022-11-17 DIAGNOSIS — I350 Nonrheumatic aortic (valve) stenosis: Secondary | ICD-10-CM | POA: Diagnosis not present

## 2022-11-17 DIAGNOSIS — I2510 Atherosclerotic heart disease of native coronary artery without angina pectoris: Secondary | ICD-10-CM | POA: Diagnosis not present

## 2022-11-17 DIAGNOSIS — E039 Hypothyroidism, unspecified: Secondary | ICD-10-CM | POA: Diagnosis not present

## 2022-11-17 DIAGNOSIS — J9809 Other diseases of bronchus, not elsewhere classified: Secondary | ICD-10-CM | POA: Diagnosis not present

## 2022-11-17 DIAGNOSIS — I25798 Atherosclerosis of other coronary artery bypass graft(s) with other forms of angina pectoris: Secondary | ICD-10-CM | POA: Diagnosis not present

## 2022-11-17 DIAGNOSIS — I5032 Chronic diastolic (congestive) heart failure: Secondary | ICD-10-CM | POA: Diagnosis not present

## 2022-11-17 DIAGNOSIS — Z09 Encounter for follow-up examination after completed treatment for conditions other than malignant neoplasm: Secondary | ICD-10-CM | POA: Diagnosis not present

## 2022-11-17 DIAGNOSIS — J90 Pleural effusion, not elsewhere classified: Secondary | ICD-10-CM | POA: Diagnosis not present

## 2022-11-17 DIAGNOSIS — R0609 Other forms of dyspnea: Secondary | ICD-10-CM | POA: Diagnosis not present

## 2022-11-24 DIAGNOSIS — H53413 Scotoma involving central area, bilateral: Secondary | ICD-10-CM | POA: Diagnosis not present

## 2022-11-24 DIAGNOSIS — H353133 Nonexudative age-related macular degeneration, bilateral, advanced atrophic without subfoveal involvement: Secondary | ICD-10-CM | POA: Diagnosis not present

## 2022-11-24 DIAGNOSIS — Z736 Limitation of activities due to disability: Secondary | ICD-10-CM | POA: Diagnosis not present

## 2022-11-24 DIAGNOSIS — H539 Unspecified visual disturbance: Secondary | ICD-10-CM | POA: Diagnosis not present

## 2022-12-10 DIAGNOSIS — H35372 Puckering of macula, left eye: Secondary | ICD-10-CM | POA: Diagnosis not present

## 2022-12-10 DIAGNOSIS — Z961 Presence of intraocular lens: Secondary | ICD-10-CM | POA: Diagnosis not present

## 2022-12-10 DIAGNOSIS — H43813 Vitreous degeneration, bilateral: Secondary | ICD-10-CM | POA: Diagnosis not present

## 2022-12-10 DIAGNOSIS — H353132 Nonexudative age-related macular degeneration, bilateral, intermediate dry stage: Secondary | ICD-10-CM | POA: Diagnosis not present

## 2022-12-23 DIAGNOSIS — Z1331 Encounter for screening for depression: Secondary | ICD-10-CM | POA: Diagnosis not present

## 2022-12-23 DIAGNOSIS — Z0000 Encounter for general adult medical examination without abnormal findings: Secondary | ICD-10-CM | POA: Diagnosis not present

## 2022-12-23 DIAGNOSIS — Z1231 Encounter for screening mammogram for malignant neoplasm of breast: Secondary | ICD-10-CM | POA: Diagnosis not present

## 2022-12-23 DIAGNOSIS — N959 Unspecified menopausal and perimenopausal disorder: Secondary | ICD-10-CM | POA: Diagnosis not present

## 2022-12-23 DIAGNOSIS — Z139 Encounter for screening, unspecified: Secondary | ICD-10-CM | POA: Diagnosis not present

## 2022-12-23 DIAGNOSIS — Z9181 History of falling: Secondary | ICD-10-CM | POA: Diagnosis not present

## 2022-12-27 ENCOUNTER — Other Ambulatory Visit: Payer: Self-pay | Admitting: Family Medicine

## 2022-12-27 DIAGNOSIS — Z1231 Encounter for screening mammogram for malignant neoplasm of breast: Secondary | ICD-10-CM

## 2022-12-29 DIAGNOSIS — I493 Ventricular premature depolarization: Secondary | ICD-10-CM | POA: Diagnosis not present

## 2022-12-29 DIAGNOSIS — N189 Chronic kidney disease, unspecified: Secondary | ICD-10-CM | POA: Diagnosis not present

## 2022-12-29 DIAGNOSIS — I130 Hypertensive heart and chronic kidney disease with heart failure and stage 1 through stage 4 chronic kidney disease, or unspecified chronic kidney disease: Secondary | ICD-10-CM | POA: Diagnosis not present

## 2022-12-29 DIAGNOSIS — I5032 Chronic diastolic (congestive) heart failure: Secondary | ICD-10-CM | POA: Diagnosis not present

## 2023-01-28 DIAGNOSIS — R102 Pelvic and perineal pain: Secondary | ICD-10-CM | POA: Diagnosis not present

## 2023-01-28 DIAGNOSIS — R0600 Dyspnea, unspecified: Secondary | ICD-10-CM | POA: Diagnosis not present

## 2023-01-28 DIAGNOSIS — R6889 Other general symptoms and signs: Secondary | ICD-10-CM | POA: Diagnosis not present

## 2023-01-28 DIAGNOSIS — R918 Other nonspecific abnormal finding of lung field: Secondary | ICD-10-CM | POA: Diagnosis not present

## 2023-02-02 ENCOUNTER — Ambulatory Visit
Admission: RE | Admit: 2023-02-02 | Discharge: 2023-02-02 | Disposition: A | Discharge status: Home / Self Care | Payer: Medicare Other | Source: Ambulatory Visit | Attending: Family Medicine | Admitting: Family Medicine

## 2023-02-02 ENCOUNTER — Encounter: Payer: Self-pay | Admitting: Radiology

## 2023-02-02 DIAGNOSIS — Z1231 Encounter for screening mammogram for malignant neoplasm of breast: Secondary | ICD-10-CM

## 2023-02-07 DIAGNOSIS — E039 Hypothyroidism, unspecified: Secondary | ICD-10-CM | POA: Diagnosis not present

## 2023-02-07 DIAGNOSIS — E7800 Pure hypercholesterolemia, unspecified: Secondary | ICD-10-CM | POA: Diagnosis not present

## 2023-02-07 DIAGNOSIS — N184 Chronic kidney disease, stage 4 (severe): Secondary | ICD-10-CM | POA: Diagnosis not present

## 2023-02-08 ENCOUNTER — Other Ambulatory Visit: Payer: Self-pay | Admitting: Family Medicine

## 2023-02-08 DIAGNOSIS — R928 Other abnormal and inconclusive findings on diagnostic imaging of breast: Secondary | ICD-10-CM

## 2023-02-09 DIAGNOSIS — E7800 Pure hypercholesterolemia, unspecified: Secondary | ICD-10-CM | POA: Diagnosis not present

## 2023-02-09 DIAGNOSIS — J8410 Pulmonary fibrosis, unspecified: Secondary | ICD-10-CM | POA: Diagnosis not present

## 2023-02-09 DIAGNOSIS — Z23 Encounter for immunization: Secondary | ICD-10-CM | POA: Diagnosis not present

## 2023-02-09 DIAGNOSIS — E559 Vitamin D deficiency, unspecified: Secondary | ICD-10-CM | POA: Diagnosis not present

## 2023-02-09 DIAGNOSIS — N184 Chronic kidney disease, stage 4 (severe): Secondary | ICD-10-CM | POA: Diagnosis not present

## 2023-02-09 DIAGNOSIS — E2839 Other primary ovarian failure: Secondary | ICD-10-CM | POA: Diagnosis not present

## 2023-02-09 DIAGNOSIS — M810 Age-related osteoporosis without current pathological fracture: Secondary | ICD-10-CM | POA: Diagnosis not present

## 2023-02-09 DIAGNOSIS — I5020 Unspecified systolic (congestive) heart failure: Secondary | ICD-10-CM | POA: Diagnosis not present

## 2023-02-09 DIAGNOSIS — E039 Hypothyroidism, unspecified: Secondary | ICD-10-CM | POA: Diagnosis not present

## 2023-02-24 ENCOUNTER — Other Ambulatory Visit: Payer: Self-pay | Admitting: Family Medicine

## 2023-02-24 ENCOUNTER — Ambulatory Visit
Admission: RE | Admit: 2023-02-24 | Discharge: 2023-02-24 | Disposition: A | Discharge status: Home / Self Care | Payer: Medicare Other | Source: Ambulatory Visit | Attending: Family Medicine | Admitting: Family Medicine

## 2023-02-24 DIAGNOSIS — R928 Other abnormal and inconclusive findings on diagnostic imaging of breast: Secondary | ICD-10-CM

## 2023-02-24 DIAGNOSIS — N6312 Unspecified lump in the right breast, upper inner quadrant: Secondary | ICD-10-CM | POA: Diagnosis not present

## 2023-02-24 DIAGNOSIS — N6310 Unspecified lump in the right breast, unspecified quadrant: Secondary | ICD-10-CM

## 2023-02-25 ENCOUNTER — Ambulatory Visit
Admission: RE | Admit: 2023-02-25 | Discharge: 2023-02-25 | Disposition: A | Discharge status: Home / Self Care | Payer: Medicare Other | Source: Ambulatory Visit | Attending: Family Medicine | Admitting: Family Medicine

## 2023-02-25 DIAGNOSIS — N6310 Unspecified lump in the right breast, unspecified quadrant: Secondary | ICD-10-CM

## 2023-02-25 DIAGNOSIS — C50211 Malignant neoplasm of upper-inner quadrant of right female breast: Secondary | ICD-10-CM | POA: Diagnosis not present

## 2023-02-25 DIAGNOSIS — N6312 Unspecified lump in the right breast, upper inner quadrant: Secondary | ICD-10-CM | POA: Diagnosis not present

## 2023-02-25 HISTORY — PX: BREAST BIOPSY: SHX20

## 2023-02-26 DIAGNOSIS — H542X11 Low vision right eye category 1, low vision left eye category 1: Secondary | ICD-10-CM | POA: Diagnosis not present

## 2023-02-26 DIAGNOSIS — H53413 Scotoma involving central area, bilateral: Secondary | ICD-10-CM | POA: Diagnosis not present

## 2023-03-01 LAB — SURGICAL PATHOLOGY

## 2023-03-09 DIAGNOSIS — I350 Nonrheumatic aortic (valve) stenosis: Secondary | ICD-10-CM | POA: Diagnosis not present

## 2023-03-15 DIAGNOSIS — C50211 Malignant neoplasm of upper-inner quadrant of right female breast: Secondary | ICD-10-CM | POA: Diagnosis not present

## 2023-03-15 DIAGNOSIS — C50911 Malignant neoplasm of unspecified site of right female breast: Secondary | ICD-10-CM | POA: Diagnosis not present

## 2023-03-15 DIAGNOSIS — Z170 Estrogen receptor positive status [ER+]: Secondary | ICD-10-CM | POA: Diagnosis not present

## 2023-03-15 DIAGNOSIS — Z1722 Progesterone receptor negative status: Secondary | ICD-10-CM | POA: Diagnosis not present

## 2023-03-15 DIAGNOSIS — Z1732 Human epidermal growth factor receptor 2 negative status: Secondary | ICD-10-CM | POA: Diagnosis not present

## 2023-03-25 DIAGNOSIS — M810 Age-related osteoporosis without current pathological fracture: Secondary | ICD-10-CM | POA: Diagnosis not present

## 2023-03-30 DIAGNOSIS — I5030 Unspecified diastolic (congestive) heart failure: Secondary | ICD-10-CM | POA: Diagnosis not present

## 2023-03-30 DIAGNOSIS — E785 Hyperlipidemia, unspecified: Secondary | ICD-10-CM | POA: Diagnosis not present

## 2023-03-30 DIAGNOSIS — Z7982 Long term (current) use of aspirin: Secondary | ICD-10-CM | POA: Diagnosis not present

## 2023-03-30 DIAGNOSIS — I2510 Atherosclerotic heart disease of native coronary artery without angina pectoris: Secondary | ICD-10-CM | POA: Diagnosis not present

## 2023-03-30 DIAGNOSIS — Z01818 Encounter for other preprocedural examination: Secondary | ICD-10-CM | POA: Diagnosis not present

## 2023-03-30 DIAGNOSIS — E039 Hypothyroidism, unspecified: Secondary | ICD-10-CM | POA: Diagnosis not present

## 2023-03-30 DIAGNOSIS — I110 Hypertensive heart disease with heart failure: Secondary | ICD-10-CM | POA: Diagnosis not present

## 2023-03-30 DIAGNOSIS — I38 Endocarditis, valve unspecified: Secondary | ICD-10-CM | POA: Diagnosis not present

## 2023-03-30 DIAGNOSIS — D0511 Intraductal carcinoma in situ of right breast: Secondary | ICD-10-CM | POA: Diagnosis not present

## 2023-03-31 DIAGNOSIS — E875 Hyperkalemia: Secondary | ICD-10-CM | POA: Diagnosis not present

## 2023-04-01 DIAGNOSIS — Z20822 Contact with and (suspected) exposure to covid-19: Secondary | ICD-10-CM | POA: Diagnosis not present

## 2023-04-01 DIAGNOSIS — J189 Pneumonia, unspecified organism: Secondary | ICD-10-CM | POA: Diagnosis not present

## 2023-04-12 DIAGNOSIS — J8410 Pulmonary fibrosis, unspecified: Secondary | ICD-10-CM | POA: Diagnosis not present

## 2023-04-12 DIAGNOSIS — J189 Pneumonia, unspecified organism: Secondary | ICD-10-CM | POA: Diagnosis not present

## 2023-04-20 DIAGNOSIS — I350 Nonrheumatic aortic (valve) stenosis: Secondary | ICD-10-CM | POA: Diagnosis not present

## 2023-04-20 DIAGNOSIS — Z952 Presence of prosthetic heart valve: Secondary | ICD-10-CM | POA: Diagnosis not present

## 2023-04-20 DIAGNOSIS — N1832 Chronic kidney disease, stage 3b: Secondary | ICD-10-CM | POA: Diagnosis not present

## 2023-04-20 DIAGNOSIS — I129 Hypertensive chronic kidney disease with stage 1 through stage 4 chronic kidney disease, or unspecified chronic kidney disease: Secondary | ICD-10-CM | POA: Diagnosis not present

## 2023-04-20 DIAGNOSIS — Z79899 Other long term (current) drug therapy: Secondary | ICD-10-CM | POA: Diagnosis not present

## 2023-04-20 DIAGNOSIS — Z8701 Personal history of pneumonia (recurrent): Secondary | ICD-10-CM | POA: Diagnosis not present

## 2023-04-20 DIAGNOSIS — N184 Chronic kidney disease, stage 4 (severe): Secondary | ICD-10-CM | POA: Diagnosis not present

## 2023-04-20 DIAGNOSIS — N2581 Secondary hyperparathyroidism of renal origin: Secondary | ICD-10-CM | POA: Diagnosis not present

## 2023-04-20 DIAGNOSIS — N139 Obstructive and reflux uropathy, unspecified: Secondary | ICD-10-CM | POA: Diagnosis not present

## 2023-04-20 DIAGNOSIS — E875 Hyperkalemia: Secondary | ICD-10-CM | POA: Diagnosis not present

## 2023-04-20 DIAGNOSIS — C50919 Malignant neoplasm of unspecified site of unspecified female breast: Secondary | ICD-10-CM | POA: Diagnosis not present

## 2023-04-20 DIAGNOSIS — R809 Proteinuria, unspecified: Secondary | ICD-10-CM | POA: Diagnosis not present

## 2023-04-20 DIAGNOSIS — R0600 Dyspnea, unspecified: Secondary | ICD-10-CM | POA: Diagnosis not present

## 2023-04-20 DIAGNOSIS — D649 Anemia, unspecified: Secondary | ICD-10-CM | POA: Diagnosis not present

## 2023-04-20 DIAGNOSIS — I517 Cardiomegaly: Secondary | ICD-10-CM | POA: Diagnosis not present

## 2023-04-25 DIAGNOSIS — M8588 Other specified disorders of bone density and structure, other site: Secondary | ICD-10-CM | POA: Diagnosis not present

## 2023-05-20 DIAGNOSIS — D509 Iron deficiency anemia, unspecified: Secondary | ICD-10-CM | POA: Diagnosis not present

## 2023-05-20 DIAGNOSIS — N184 Chronic kidney disease, stage 4 (severe): Secondary | ICD-10-CM | POA: Diagnosis not present

## 2023-05-20 DIAGNOSIS — I129 Hypertensive chronic kidney disease with stage 1 through stage 4 chronic kidney disease, or unspecified chronic kidney disease: Secondary | ICD-10-CM | POA: Diagnosis not present

## 2023-05-20 DIAGNOSIS — C50911 Malignant neoplasm of unspecified site of right female breast: Secondary | ICD-10-CM | POA: Diagnosis not present

## 2023-05-20 DIAGNOSIS — Z01818 Encounter for other preprocedural examination: Secondary | ICD-10-CM | POA: Diagnosis not present

## 2023-05-20 DIAGNOSIS — E039 Hypothyroidism, unspecified: Secondary | ICD-10-CM | POA: Diagnosis not present

## 2023-05-23 DIAGNOSIS — C50211 Malignant neoplasm of upper-inner quadrant of right female breast: Secondary | ICD-10-CM | POA: Diagnosis not present

## 2023-05-23 DIAGNOSIS — Z7982 Long term (current) use of aspirin: Secondary | ICD-10-CM | POA: Diagnosis not present

## 2023-05-23 DIAGNOSIS — M25511 Pain in right shoulder: Secondary | ICD-10-CM | POA: Diagnosis not present

## 2023-05-23 DIAGNOSIS — Z170 Estrogen receptor positive status [ER+]: Secondary | ICD-10-CM | POA: Diagnosis not present

## 2023-05-23 DIAGNOSIS — Z953 Presence of xenogenic heart valve: Secondary | ICD-10-CM | POA: Diagnosis not present

## 2023-05-23 DIAGNOSIS — E039 Hypothyroidism, unspecified: Secondary | ICD-10-CM | POA: Diagnosis not present

## 2023-05-23 DIAGNOSIS — D0511 Intraductal carcinoma in situ of right breast: Secondary | ICD-10-CM | POA: Diagnosis not present

## 2023-05-23 DIAGNOSIS — R0789 Other chest pain: Secondary | ICD-10-CM | POA: Diagnosis not present

## 2023-05-23 DIAGNOSIS — R920 Mammographic microcalcification found on diagnostic imaging of breast: Secondary | ICD-10-CM | POA: Diagnosis not present

## 2023-05-23 DIAGNOSIS — D0591 Unspecified type of carcinoma in situ of right breast: Secondary | ICD-10-CM | POA: Diagnosis not present

## 2023-05-23 DIAGNOSIS — Z79899 Other long term (current) drug therapy: Secondary | ICD-10-CM | POA: Diagnosis not present

## 2023-05-23 DIAGNOSIS — C50911 Malignant neoplasm of unspecified site of right female breast: Secondary | ICD-10-CM | POA: Diagnosis not present

## 2023-05-23 DIAGNOSIS — F329 Major depressive disorder, single episode, unspecified: Secondary | ICD-10-CM | POA: Diagnosis not present

## 2023-05-23 DIAGNOSIS — N183 Chronic kidney disease, stage 3 unspecified: Secondary | ICD-10-CM | POA: Diagnosis not present

## 2023-05-23 DIAGNOSIS — Z79890 Hormone replacement therapy (postmenopausal): Secondary | ICD-10-CM | POA: Diagnosis not present

## 2023-05-23 DIAGNOSIS — E785 Hyperlipidemia, unspecified: Secondary | ICD-10-CM | POA: Diagnosis not present

## 2023-05-23 DIAGNOSIS — F419 Anxiety disorder, unspecified: Secondary | ICD-10-CM | POA: Diagnosis not present

## 2023-05-24 DIAGNOSIS — R079 Chest pain, unspecified: Secondary | ICD-10-CM | POA: Diagnosis not present

## 2023-05-24 DIAGNOSIS — F419 Anxiety disorder, unspecified: Secondary | ICD-10-CM | POA: Diagnosis not present

## 2023-05-24 DIAGNOSIS — D0591 Unspecified type of carcinoma in situ of right breast: Secondary | ICD-10-CM | POA: Diagnosis not present

## 2023-05-24 DIAGNOSIS — Z953 Presence of xenogenic heart valve: Secondary | ICD-10-CM | POA: Diagnosis not present

## 2023-05-24 DIAGNOSIS — E785 Hyperlipidemia, unspecified: Secondary | ICD-10-CM | POA: Diagnosis not present

## 2023-05-24 DIAGNOSIS — E039 Hypothyroidism, unspecified: Secondary | ICD-10-CM | POA: Diagnosis not present

## 2023-05-24 DIAGNOSIS — N183 Chronic kidney disease, stage 3 unspecified: Secondary | ICD-10-CM | POA: Diagnosis not present

## 2023-05-25 DIAGNOSIS — E039 Hypothyroidism, unspecified: Secondary | ICD-10-CM | POA: Diagnosis not present

## 2023-05-25 DIAGNOSIS — E785 Hyperlipidemia, unspecified: Secondary | ICD-10-CM | POA: Diagnosis not present

## 2023-05-25 DIAGNOSIS — F419 Anxiety disorder, unspecified: Secondary | ICD-10-CM | POA: Diagnosis not present

## 2023-05-25 DIAGNOSIS — Z953 Presence of xenogenic heart valve: Secondary | ICD-10-CM | POA: Diagnosis not present

## 2023-05-25 DIAGNOSIS — N183 Chronic kidney disease, stage 3 unspecified: Secondary | ICD-10-CM | POA: Diagnosis not present

## 2023-05-25 DIAGNOSIS — D0591 Unspecified type of carcinoma in situ of right breast: Secondary | ICD-10-CM | POA: Diagnosis not present

## 2023-05-31 DIAGNOSIS — H43813 Vitreous degeneration, bilateral: Secondary | ICD-10-CM | POA: Diagnosis not present

## 2023-05-31 DIAGNOSIS — H35372 Puckering of macula, left eye: Secondary | ICD-10-CM | POA: Diagnosis not present

## 2023-05-31 DIAGNOSIS — Z961 Presence of intraocular lens: Secondary | ICD-10-CM | POA: Diagnosis not present

## 2023-05-31 DIAGNOSIS — H353132 Nonexudative age-related macular degeneration, bilateral, intermediate dry stage: Secondary | ICD-10-CM | POA: Diagnosis not present

## 2023-06-04 DIAGNOSIS — H542X11 Low vision right eye category 1, low vision left eye category 1: Secondary | ICD-10-CM | POA: Diagnosis not present

## 2023-06-04 DIAGNOSIS — H53413 Scotoma involving central area, bilateral: Secondary | ICD-10-CM | POA: Diagnosis not present

## 2023-06-08 DIAGNOSIS — C50911 Malignant neoplasm of unspecified site of right female breast: Secondary | ICD-10-CM | POA: Diagnosis not present

## 2023-07-09 DIAGNOSIS — H53413 Scotoma involving central area, bilateral: Secondary | ICD-10-CM | POA: Diagnosis not present

## 2023-07-09 DIAGNOSIS — H542X11 Low vision right eye category 1, low vision left eye category 1: Secondary | ICD-10-CM | POA: Diagnosis not present

## 2023-07-25 DIAGNOSIS — C50911 Malignant neoplasm of unspecified site of right female breast: Secondary | ICD-10-CM | POA: Diagnosis not present

## 2023-08-09 DIAGNOSIS — E7800 Pure hypercholesterolemia, unspecified: Secondary | ICD-10-CM | POA: Diagnosis not present

## 2023-08-09 DIAGNOSIS — N184 Chronic kidney disease, stage 4 (severe): Secondary | ICD-10-CM | POA: Diagnosis not present

## 2023-08-09 DIAGNOSIS — E039 Hypothyroidism, unspecified: Secondary | ICD-10-CM | POA: Diagnosis not present

## 2023-08-11 DIAGNOSIS — F419 Anxiety disorder, unspecified: Secondary | ICD-10-CM | POA: Diagnosis not present

## 2023-08-11 DIAGNOSIS — E559 Vitamin D deficiency, unspecified: Secondary | ICD-10-CM | POA: Diagnosis not present

## 2023-08-11 DIAGNOSIS — E7800 Pure hypercholesterolemia, unspecified: Secondary | ICD-10-CM | POA: Diagnosis not present

## 2023-08-11 DIAGNOSIS — M810 Age-related osteoporosis without current pathological fracture: Secondary | ICD-10-CM | POA: Diagnosis not present

## 2023-08-11 DIAGNOSIS — E039 Hypothyroidism, unspecified: Secondary | ICD-10-CM | POA: Diagnosis not present

## 2023-08-11 DIAGNOSIS — N1832 Chronic kidney disease, stage 3b: Secondary | ICD-10-CM | POA: Diagnosis not present

## 2023-08-11 DIAGNOSIS — J8410 Pulmonary fibrosis, unspecified: Secondary | ICD-10-CM | POA: Diagnosis not present

## 2023-08-11 DIAGNOSIS — I5020 Unspecified systolic (congestive) heart failure: Secondary | ICD-10-CM | POA: Diagnosis not present

## 2023-09-27 DIAGNOSIS — M810 Age-related osteoporosis without current pathological fracture: Secondary | ICD-10-CM | POA: Diagnosis not present

## 2023-10-10 DIAGNOSIS — I071 Rheumatic tricuspid insufficiency: Secondary | ICD-10-CM | POA: Diagnosis not present

## 2023-10-10 DIAGNOSIS — I359 Nonrheumatic aortic valve disorder, unspecified: Secondary | ICD-10-CM | POA: Diagnosis not present

## 2023-10-10 DIAGNOSIS — Z952 Presence of prosthetic heart valve: Secondary | ICD-10-CM | POA: Diagnosis not present

## 2023-11-04 DIAGNOSIS — N1832 Chronic kidney disease, stage 3b: Secondary | ICD-10-CM | POA: Diagnosis not present

## 2023-11-04 DIAGNOSIS — N2581 Secondary hyperparathyroidism of renal origin: Secondary | ICD-10-CM | POA: Diagnosis not present

## 2023-11-04 DIAGNOSIS — D649 Anemia, unspecified: Secondary | ICD-10-CM | POA: Diagnosis not present

## 2023-11-04 DIAGNOSIS — I129 Hypertensive chronic kidney disease with stage 1 through stage 4 chronic kidney disease, or unspecified chronic kidney disease: Secondary | ICD-10-CM | POA: Diagnosis not present

## 2023-11-16 DIAGNOSIS — H53413 Scotoma involving central area, bilateral: Secondary | ICD-10-CM | POA: Diagnosis not present

## 2023-11-16 DIAGNOSIS — H353133 Nonexudative age-related macular degeneration, bilateral, advanced atrophic without subfoveal involvement: Secondary | ICD-10-CM | POA: Diagnosis not present

## 2023-11-17 DIAGNOSIS — E875 Hyperkalemia: Secondary | ICD-10-CM | POA: Diagnosis not present

## 2023-12-01 DIAGNOSIS — Z952 Presence of prosthetic heart valve: Secondary | ICD-10-CM | POA: Diagnosis not present

## 2023-12-27 DIAGNOSIS — H353132 Nonexudative age-related macular degeneration, bilateral, intermediate dry stage: Secondary | ICD-10-CM | POA: Diagnosis not present

## 2023-12-27 DIAGNOSIS — H35372 Puckering of macula, left eye: Secondary | ICD-10-CM | POA: Diagnosis not present

## 2023-12-27 DIAGNOSIS — H43813 Vitreous degeneration, bilateral: Secondary | ICD-10-CM | POA: Diagnosis not present

## 2023-12-27 DIAGNOSIS — Z961 Presence of intraocular lens: Secondary | ICD-10-CM | POA: Diagnosis not present

## 2024-01-10 DIAGNOSIS — F4321 Adjustment disorder with depressed mood: Secondary | ICD-10-CM | POA: Diagnosis not present

## 2024-01-10 DIAGNOSIS — Z1331 Encounter for screening for depression: Secondary | ICD-10-CM | POA: Diagnosis not present

## 2024-01-10 DIAGNOSIS — H548 Legal blindness, as defined in USA: Secondary | ICD-10-CM | POA: Diagnosis not present

## 2024-01-10 DIAGNOSIS — F419 Anxiety disorder, unspecified: Secondary | ICD-10-CM | POA: Diagnosis not present

## 2024-01-10 DIAGNOSIS — Z23 Encounter for immunization: Secondary | ICD-10-CM | POA: Diagnosis not present

## 2024-01-10 DIAGNOSIS — K219 Gastro-esophageal reflux disease without esophagitis: Secondary | ICD-10-CM | POA: Diagnosis not present

## 2024-02-07 DIAGNOSIS — E039 Hypothyroidism, unspecified: Secondary | ICD-10-CM | POA: Diagnosis not present

## 2024-02-07 DIAGNOSIS — E7800 Pure hypercholesterolemia, unspecified: Secondary | ICD-10-CM | POA: Diagnosis not present

## 2024-02-07 DIAGNOSIS — E559 Vitamin D deficiency, unspecified: Secondary | ICD-10-CM | POA: Diagnosis not present

## 2024-02-07 DIAGNOSIS — N1832 Chronic kidney disease, stage 3b: Secondary | ICD-10-CM | POA: Diagnosis not present

## 2024-02-11 DIAGNOSIS — H53413 Scotoma involving central area, bilateral: Secondary | ICD-10-CM | POA: Diagnosis not present

## 2024-02-14 DIAGNOSIS — J8410 Pulmonary fibrosis, unspecified: Secondary | ICD-10-CM | POA: Diagnosis not present

## 2024-02-14 DIAGNOSIS — Z23 Encounter for immunization: Secondary | ICD-10-CM | POA: Diagnosis not present

## 2024-02-14 DIAGNOSIS — M810 Age-related osteoporosis without current pathological fracture: Secondary | ICD-10-CM | POA: Diagnosis not present

## 2024-02-14 DIAGNOSIS — F419 Anxiety disorder, unspecified: Secondary | ICD-10-CM | POA: Diagnosis not present

## 2024-02-14 DIAGNOSIS — E559 Vitamin D deficiency, unspecified: Secondary | ICD-10-CM | POA: Diagnosis not present

## 2024-02-14 DIAGNOSIS — N1832 Chronic kidney disease, stage 3b: Secondary | ICD-10-CM | POA: Diagnosis not present

## 2024-02-14 DIAGNOSIS — I5020 Unspecified systolic (congestive) heart failure: Secondary | ICD-10-CM | POA: Diagnosis not present

## 2024-02-14 DIAGNOSIS — E7800 Pure hypercholesterolemia, unspecified: Secondary | ICD-10-CM | POA: Diagnosis not present

## 2024-02-14 DIAGNOSIS — R55 Syncope and collapse: Secondary | ICD-10-CM | POA: Diagnosis not present

## 2024-02-14 DIAGNOSIS — E039 Hypothyroidism, unspecified: Secondary | ICD-10-CM | POA: Diagnosis not present

## 2024-02-16 DIAGNOSIS — R55 Syncope and collapse: Secondary | ICD-10-CM | POA: Diagnosis not present

## 2024-03-01 DIAGNOSIS — R55 Syncope and collapse: Secondary | ICD-10-CM | POA: Diagnosis not present

## 2024-03-03 DIAGNOSIS — I471 Supraventricular tachycardia, unspecified: Secondary | ICD-10-CM | POA: Diagnosis not present

## 2024-03-06 DIAGNOSIS — Z79899 Other long term (current) drug therapy: Secondary | ICD-10-CM | POA: Diagnosis not present

## 2024-03-29 DIAGNOSIS — M810 Age-related osteoporosis without current pathological fracture: Secondary | ICD-10-CM | POA: Diagnosis not present
